# Patient Record
Sex: Female | Born: 1975
Health system: Southern US, Community
[De-identification: ages and names within clinical notes are randomized; demographics above are authoritative.]

## PROBLEM LIST (undated history)

## (undated) DIAGNOSIS — Z789 Other specified health status: Secondary | ICD-10-CM

## (undated) HISTORY — DX: Other specified health status: Z78.9

## (undated) HISTORY — PX: OTHER SURGICAL HISTORY: SHX169

---

## 1999-10-17 ENCOUNTER — Other Ambulatory Visit: Admission: RE | Admit: 1999-10-17 | Discharge: 1999-10-17 | Payer: Self-pay | Admitting: Obstetrics and Gynecology

## 2000-02-09 ENCOUNTER — Emergency Department (HOSPITAL_COMMUNITY): Admission: EM | Admit: 2000-02-09 | Discharge: 2000-02-09 | Payer: Self-pay | Admitting: Internal Medicine

## 2000-02-19 ENCOUNTER — Emergency Department (HOSPITAL_COMMUNITY): Admission: EM | Admit: 2000-02-19 | Discharge: 2000-02-19 | Payer: Self-pay | Admitting: Unknown Physician Specialty

## 2001-01-16 ENCOUNTER — Other Ambulatory Visit: Admission: RE | Admit: 2001-01-16 | Discharge: 2001-01-16 | Payer: Self-pay | Admitting: Obstetrics and Gynecology

## 2002-01-20 ENCOUNTER — Other Ambulatory Visit: Admission: RE | Admit: 2002-01-20 | Discharge: 2002-01-20 | Payer: Self-pay | Admitting: Obstetrics and Gynecology

## 2003-01-24 ENCOUNTER — Other Ambulatory Visit: Admission: RE | Admit: 2003-01-24 | Discharge: 2003-01-24 | Payer: Self-pay | Admitting: Obstetrics and Gynecology

## 2003-08-15 ENCOUNTER — Other Ambulatory Visit: Admission: RE | Admit: 2003-08-15 | Discharge: 2003-08-15 | Payer: Self-pay | Admitting: Obstetrics and Gynecology

## 2004-01-19 ENCOUNTER — Inpatient Hospital Stay (HOSPITAL_COMMUNITY): Admission: AD | Admit: 2004-01-19 | Discharge: 2004-02-20 | Payer: Self-pay | Admitting: Obstetrics and Gynecology

## 2004-02-19 ENCOUNTER — Encounter (INDEPENDENT_AMBULATORY_CARE_PROVIDER_SITE_OTHER): Payer: Self-pay | Admitting: Specialist

## 2004-03-16 ENCOUNTER — Other Ambulatory Visit: Admission: RE | Admit: 2004-03-16 | Discharge: 2004-03-16 | Payer: Self-pay | Admitting: Obstetrics and Gynecology

## 2004-12-18 ENCOUNTER — Ambulatory Visit: Payer: Self-pay | Admitting: Family Medicine

## 2005-09-10 IMAGING — US US FETAL BPP W/O NONSTRESS
1 series · 18 of 28 positions shown · non-contrast
Comparison: none

CLINICAL DATA: 28-year-old female at approximately 33 weeks gestation.  Preterm bleeding and cramping.

[Series 1: us ob comp +14 wk · 18 of 71 slices shown]
[im 1/71]
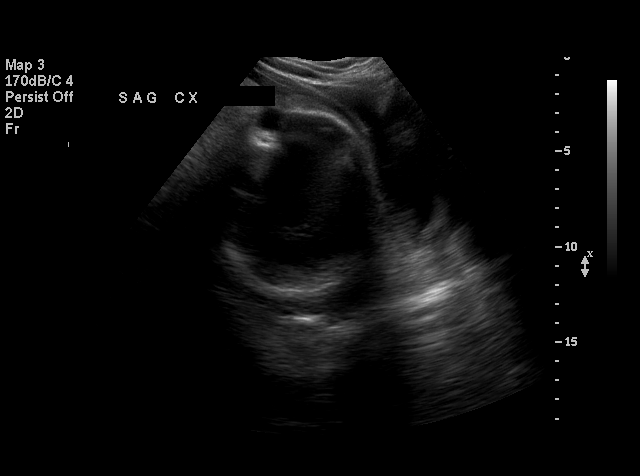
[im 6/71]
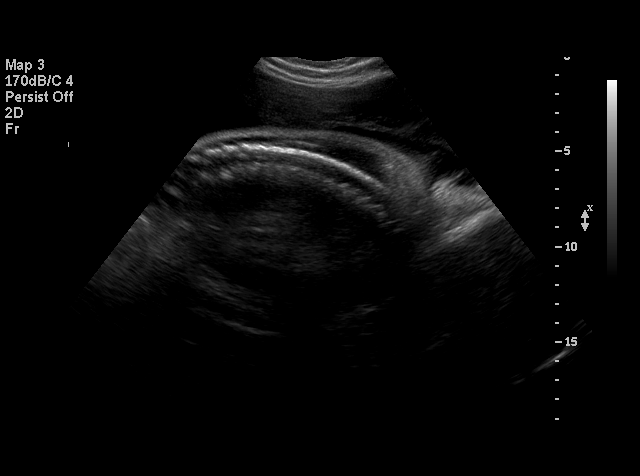
[im 8/71]
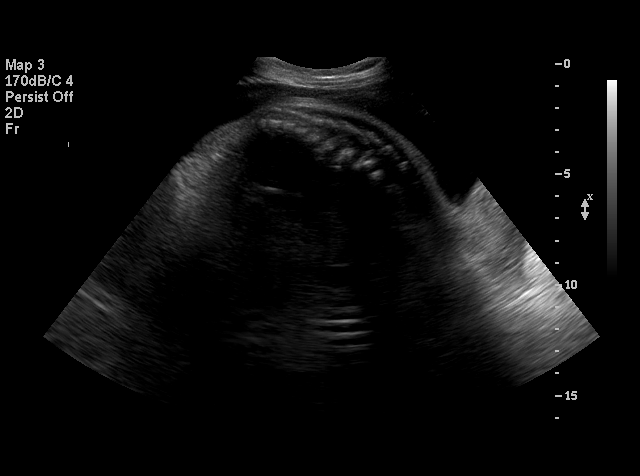
[im 13/71]
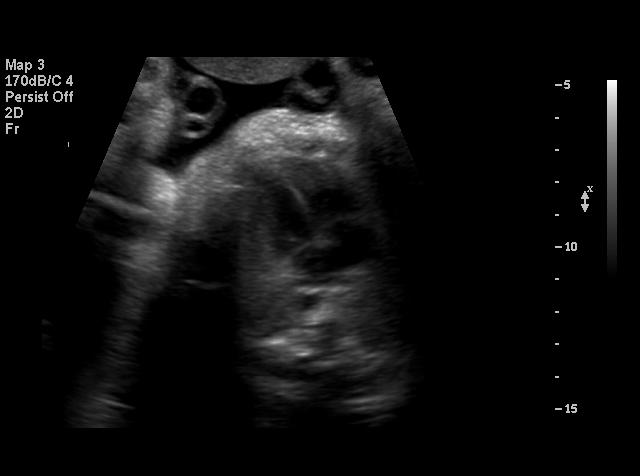
[im 19/71]
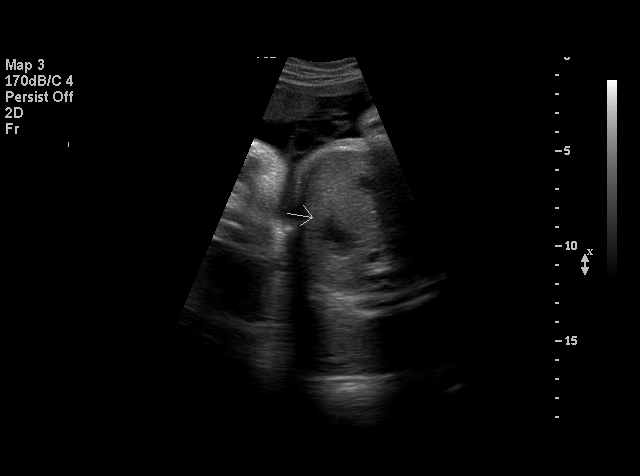
[im 21/71]
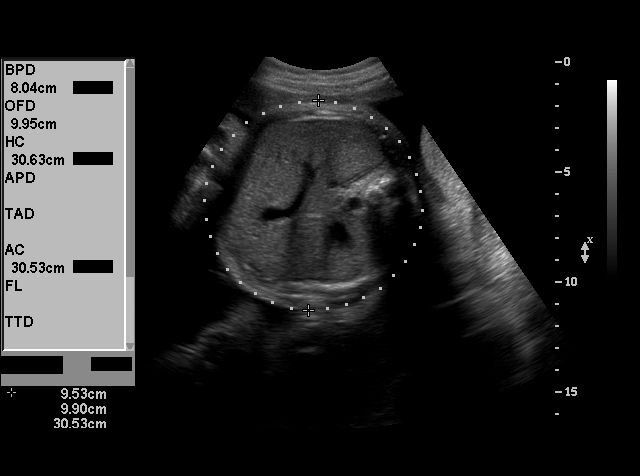
[im 26/71]
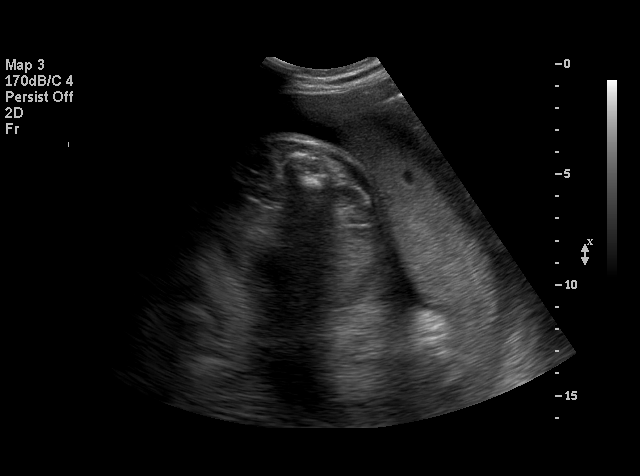
[im 29/71]
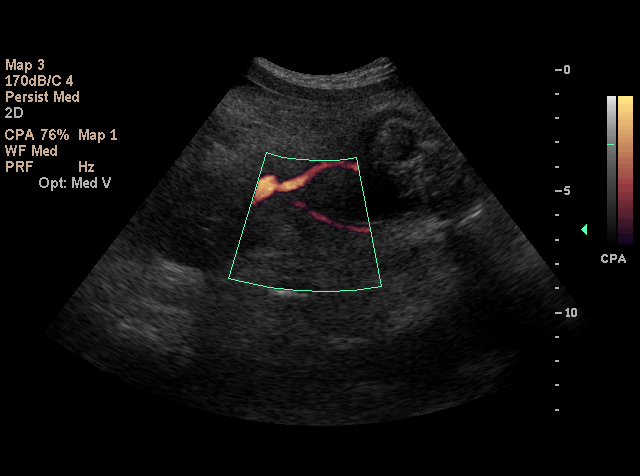
[im 34/71]
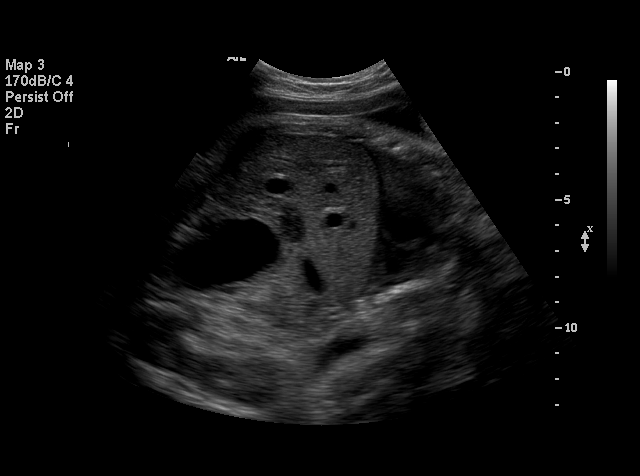
[im 37/71]
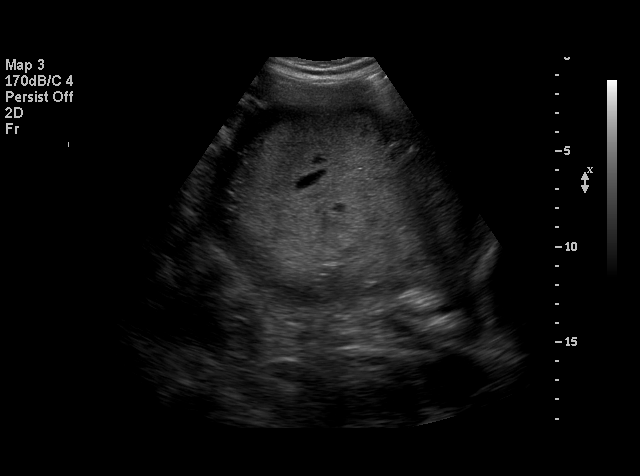
[im 42/71]
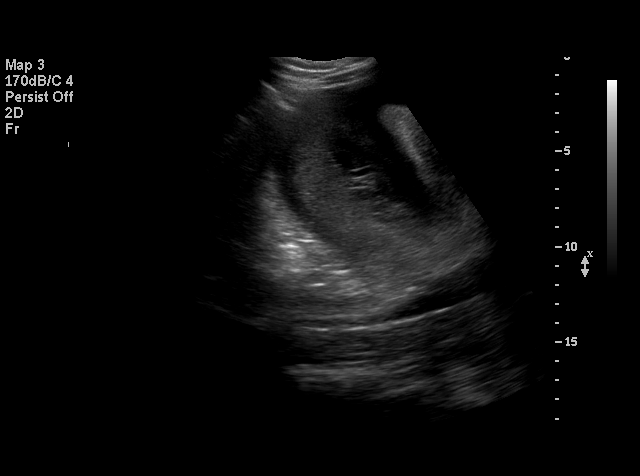
[im 45/71]
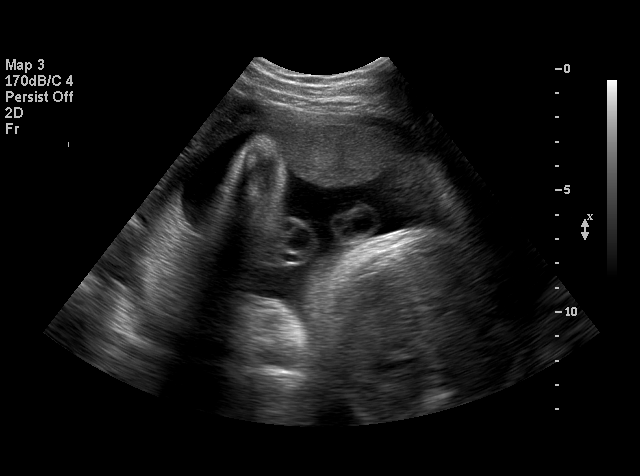
[im 50/71]
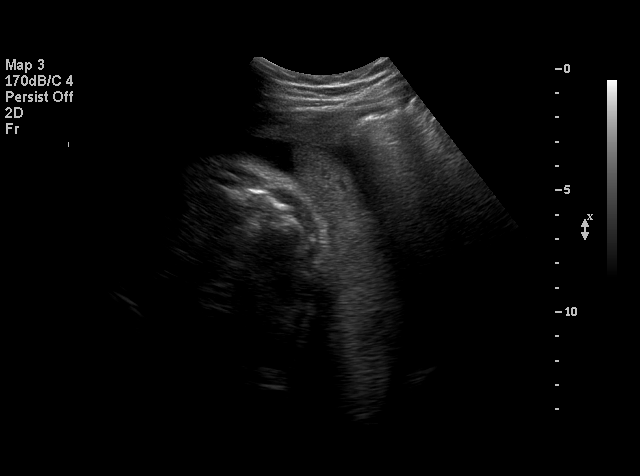
[im 55/71]
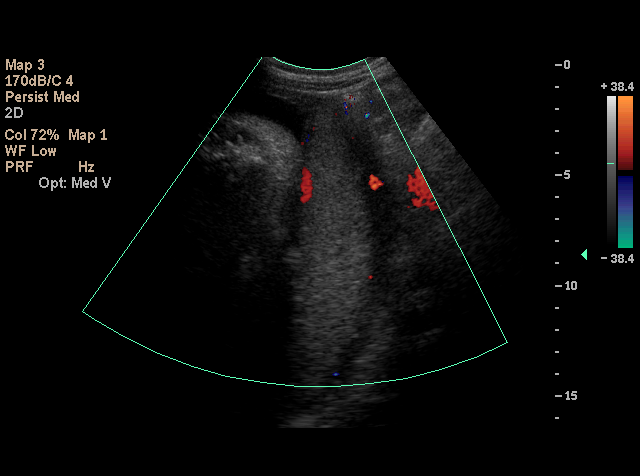
[im 58/71]
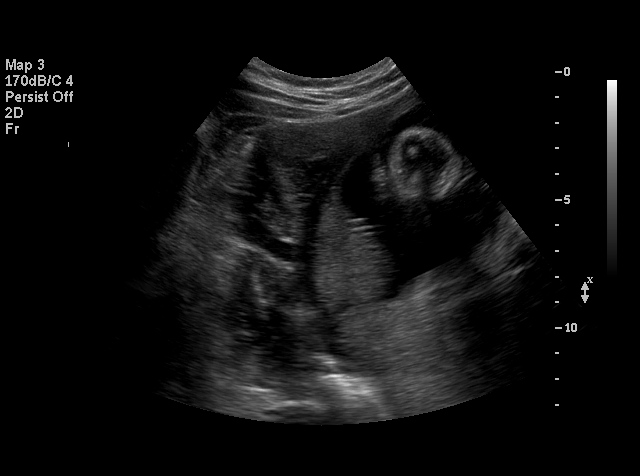
[im 63/71]
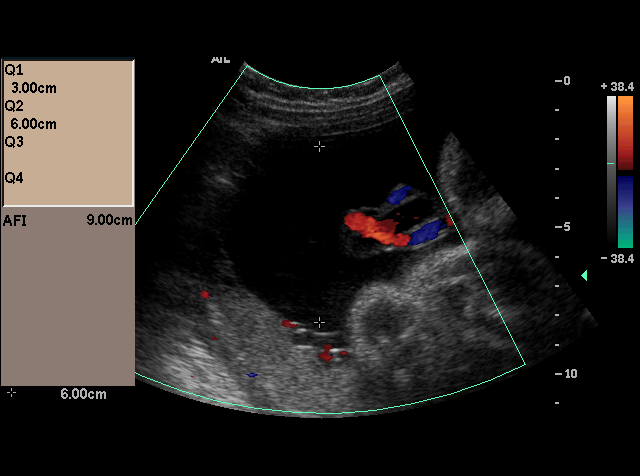
[im 65/71]
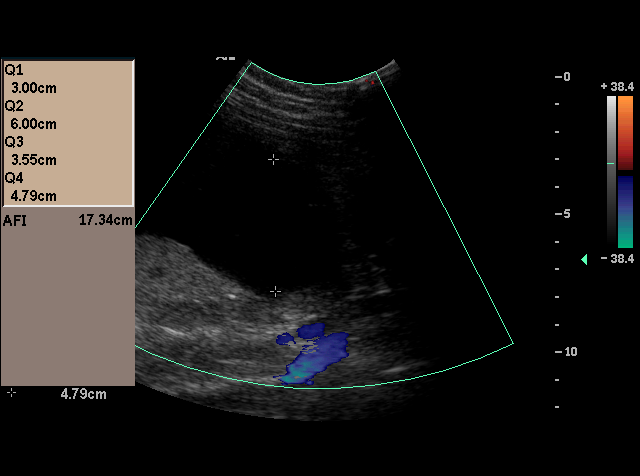
[im 71/71]
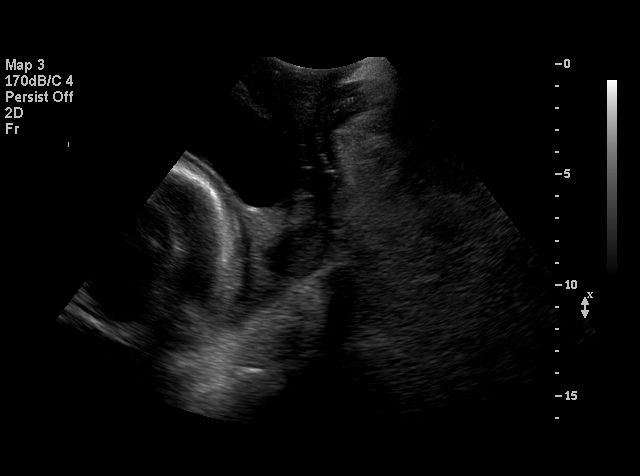

[18 of 28 positions shown; findings below may reference images not displayed]

OBSTETRICAL ULTRASOUND
Number of Fetuses:  1
Heart Rate:  154
Movement:  Yes
Breathing:  Yes  
Presentation:  Cephalic
Placental Location:  Fundal, posterior, left lateral
Grade:  I
Previa:  No
Comment:  Anterior succenturiate placenta noted on the right.  
Amniotic Fluid (Subjective):  Normal
Amniotic Fluid (Objective):   17.3 cm AFI (5th -95th%ile =   8.3 ? 24.5 cm for 33 wks)

FETAL BIOMETRY
BPD:  7.8 cm   31 w 2 d
HC:  30.5 cm   34 w 0 d
AC:  30.3 cm   34 w 2 d
FL:  6.3 cm   32 w 5 d

MEAN GA:  33 w 1 d

EFW:  5597 g (H) 75th ? 90th%ile (5755 ? 7674 g) For 33 wks

FETAL ANATOMY
Lateral Ventricles:    Not visualized 
Thalami/CSP:      Visualized 
Posterior Fossa:  Not visualized 
Nuchal Region:    N/A
Spine:      Visualized 
4 Chamber Heart on Left:      Visualized 
Stomach on Left:      Visualized 
3 Vessel Cord:    Visualized 
Cord Insertion site:    Visualized 
Kidneys:  Visualized 
Bladder:  Visualized 
Extremities:      Not visualized   

ADDITIONAL ANATOMY VISUALIZED:  LVOT, RVOT, orbits, diaphragm, and female genitalia

Evaluation limited by:  Fetal position and advanced gestational age 

MATERNAL FINDINGS
Cervix:   3.4 cm Transabdominally

BIOPHYSICAL PROFILE

Movement:  2    Time:  30
Breathing:  0
Tone:  2
Amniotic Fluid:  2

Total Score:  6
IMPRESSION: Single living intrauterine gestation in cephalic presentation with estimated gestational age by this ultrasound of 33 weeks 1 day.  
Posterior fundal placenta without evidence of placenta previa.  
Normal amniotic fluid volume.
Fetal anatomy as described, limited by fetal position and advanced gestational age.
BPP [DATE] at 30 minutes.

## 2005-09-14 IMAGING — US US FETAL BPP W/O NONSTRESS
1 series · 18 of 23 positions shown · non-contrast
Comparison: none

CLINICAL DATA: Vaginal bleeding.  Assess fetal well-being.

[Series 1: us ob re-eval · 18 of 23 slices shown]
[im 1/23]
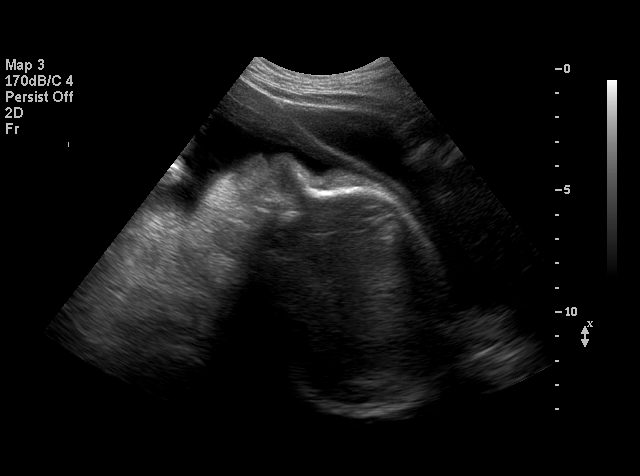
[im 2/23]
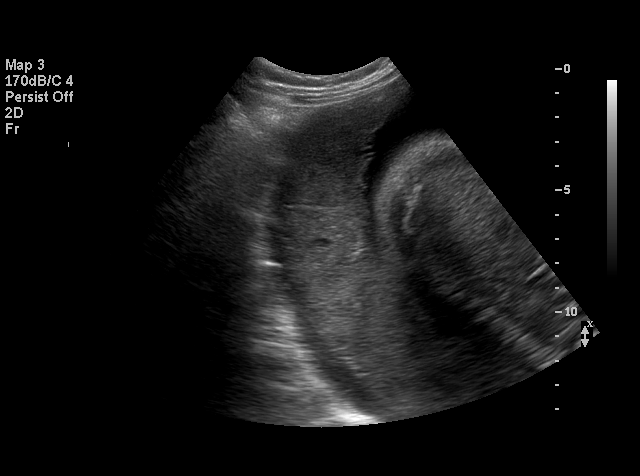
[im 4/23]
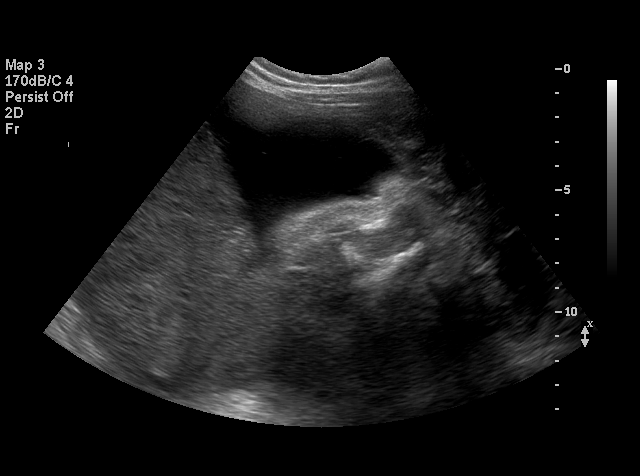
[im 5/23]
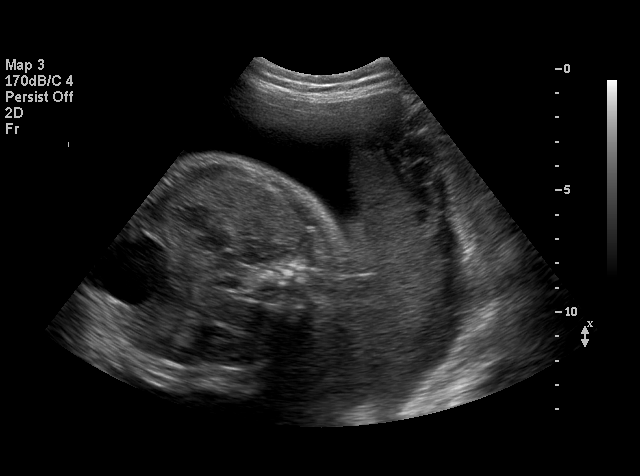
[im 6/23]
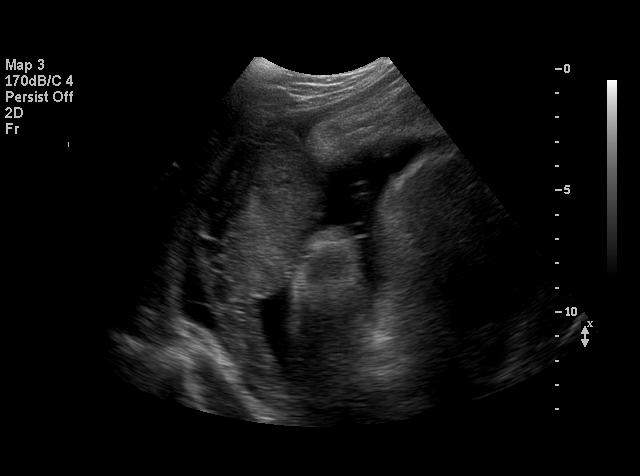
[im 8/23]
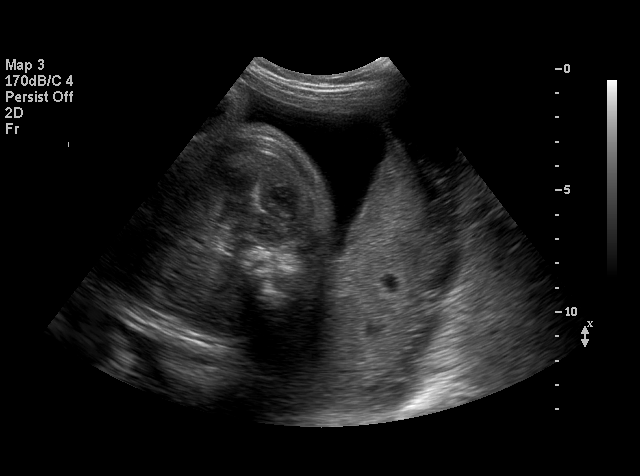
[im 9/23]
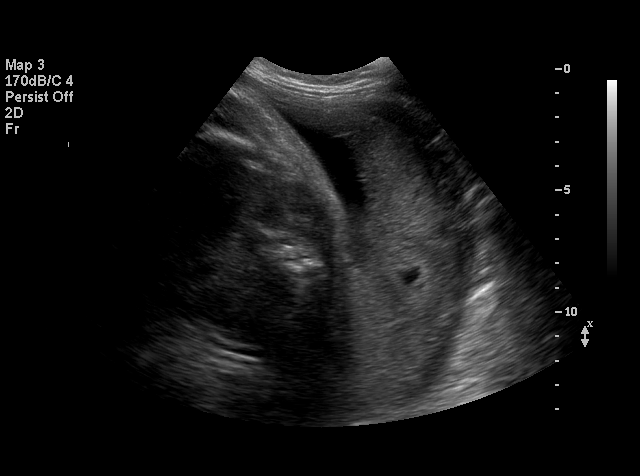
[im 10/23]
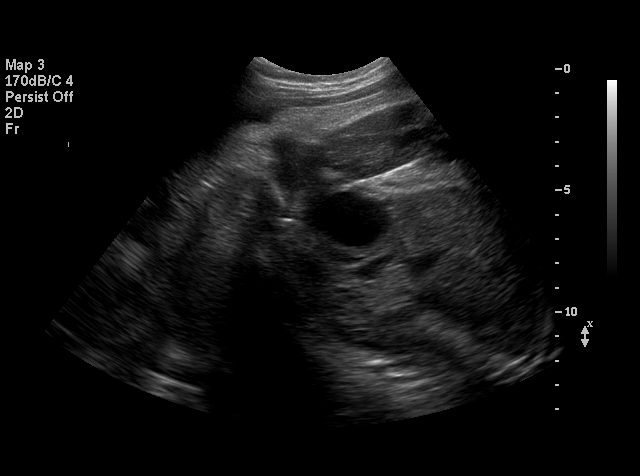
[im 11/23]
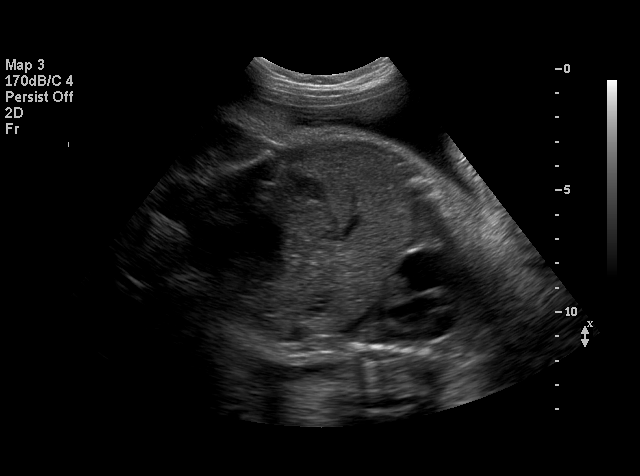
[im 13/23]
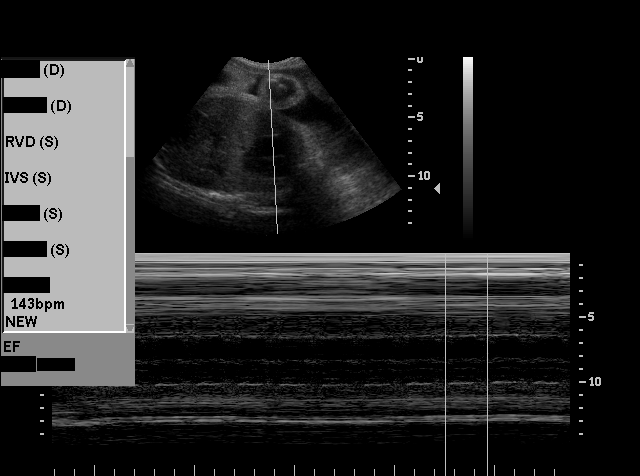
[im 14/23]
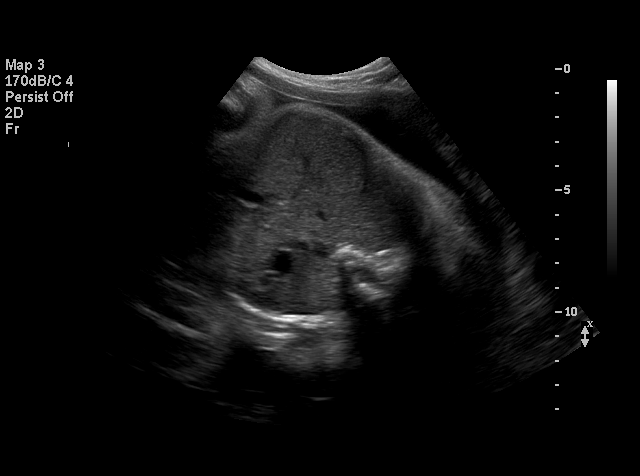
[im 15/23]
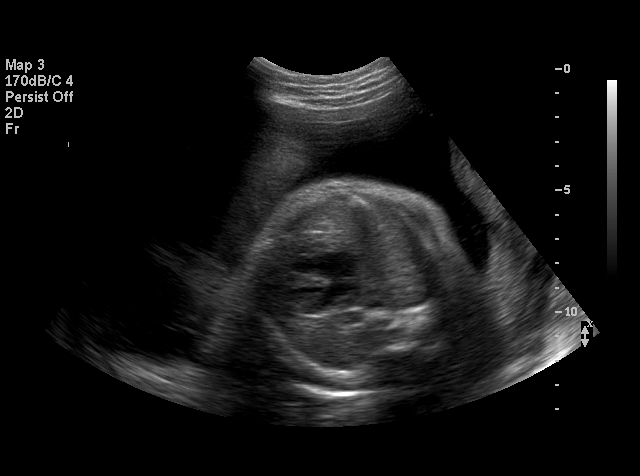
[im 16/23]
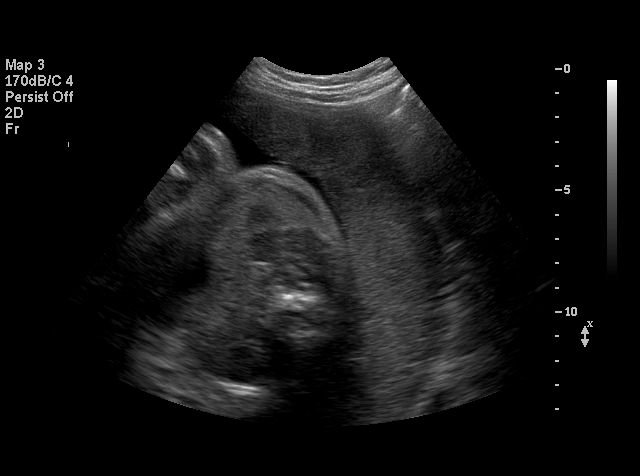
[im 18/23]
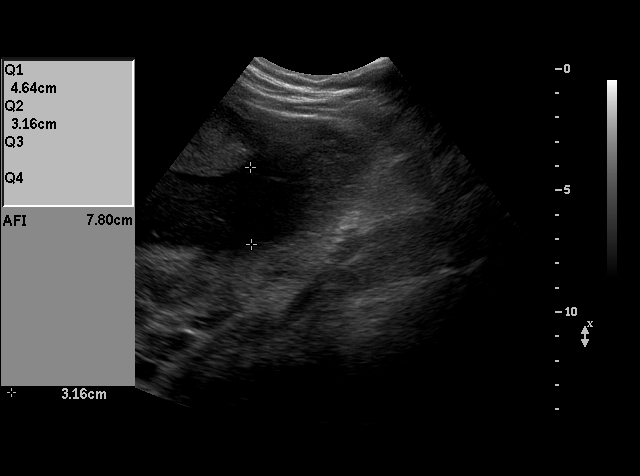
[im 19/23]
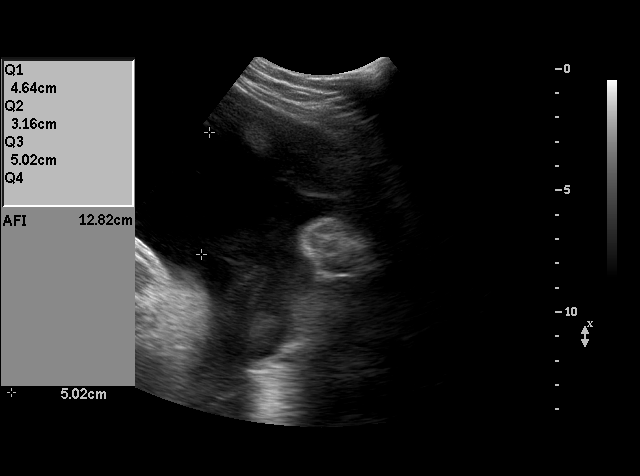
[im 20/23]
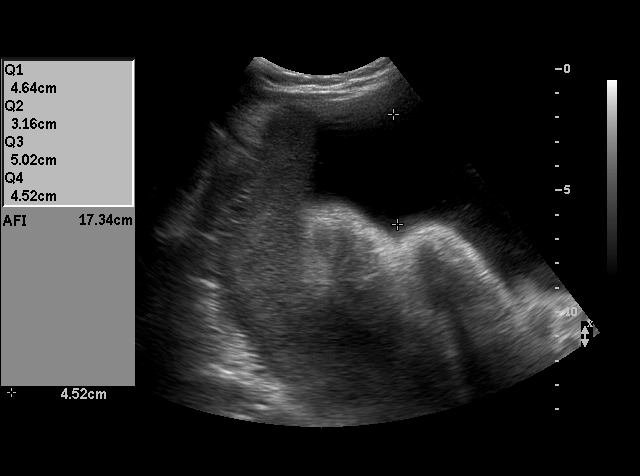
[im 22/23]
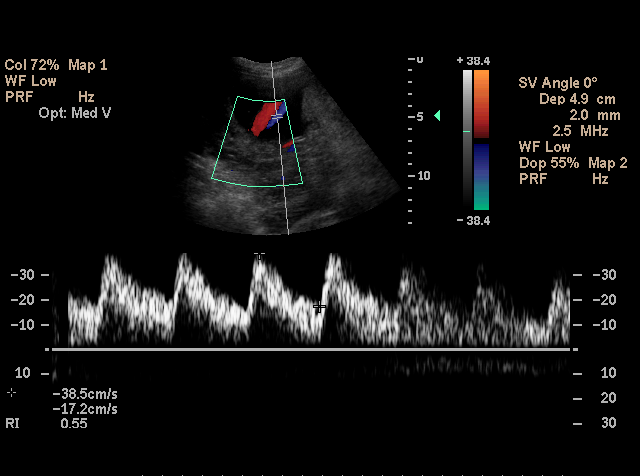
[im 23/23]
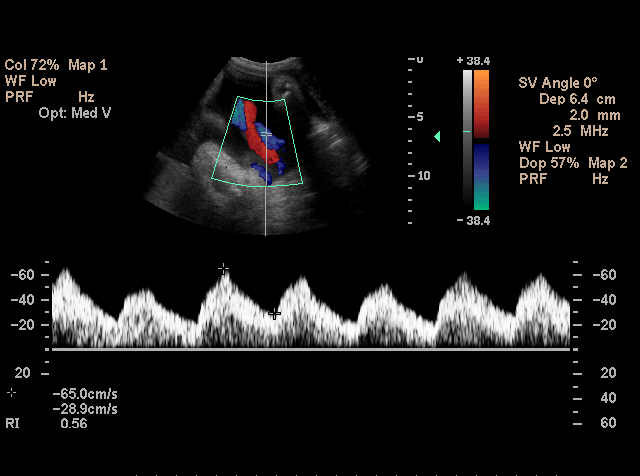

[18 of 23 positions shown; findings below may reference images not displayed]

BIOPHYSICAL PROFILE  

Number of Fetuses:  1
Heart rate:  143
Movement:  Yes
Breathing:  Yes
Presentation:  Cephalic
Placental Location:  Fundal, posterior
Grade:  II
Previa:  No
Comment:  Again noted is an anterior succenturiate lobe.
Amniotic Fluid (Subjective):  Normal
Amniotic Fluid (Objective):  17.3 cm AFI (5th -95th%ile =   8.1 ? 24.8 cm for 34 wks)

Fetal measurements and complete anatomic evaluation were not requested.  The following fetal anatomy was visualized on this exam:  Stomach, kidneys, bladder and diaphragm

BPP SCORING
Movements:  2  Time:  10 minutes
Breathing:  2
Tone:  2
Amniotic Fluid:  2
Total Score:  8

MATERNAL FINDINGS:
Cervix:  Not evaluated

DOPPLER ULTRASOUND OF FETUS

Umbilical Artery S/D Ratio:  2.30    (NL< 3.57)
IMPRESSION: Biophysical profile score 8 of 8.
Subjectively and quantitatively normal amniotic fluid. 
No focal late developing anatomic abnormalities are seen with reevaluation of the stomach, kidneys, and bladder.  Stable anterior succenturiate lobe.
Normalization of the umbilical artery doppler is noted today.  Again the middle cerebral artery doppler exam could not be performed due to head postion.

## 2005-09-16 IMAGING — CR DG CHEST 1V
2 series · 2 of 2 positions shown · non-contrast
Comparison: none

CLINICAL DATA: Difficulty breathing.  Reevaluate since prior study on 01/24/04.
PA CHEST:
Comparison is made to prior study dated 01/24/04.  
Lungs remain poorly expanded.  There has been no change in the bibasilar density, likely representing atelectasis and/or pneumonia.  There is still a small amount of fluid in the minor fissure.  No new findings are noted.
IMPRESSION
  No significant change in bibasilar atelectasis and/or pneumonia.

[view not recorded (1 of 2)]
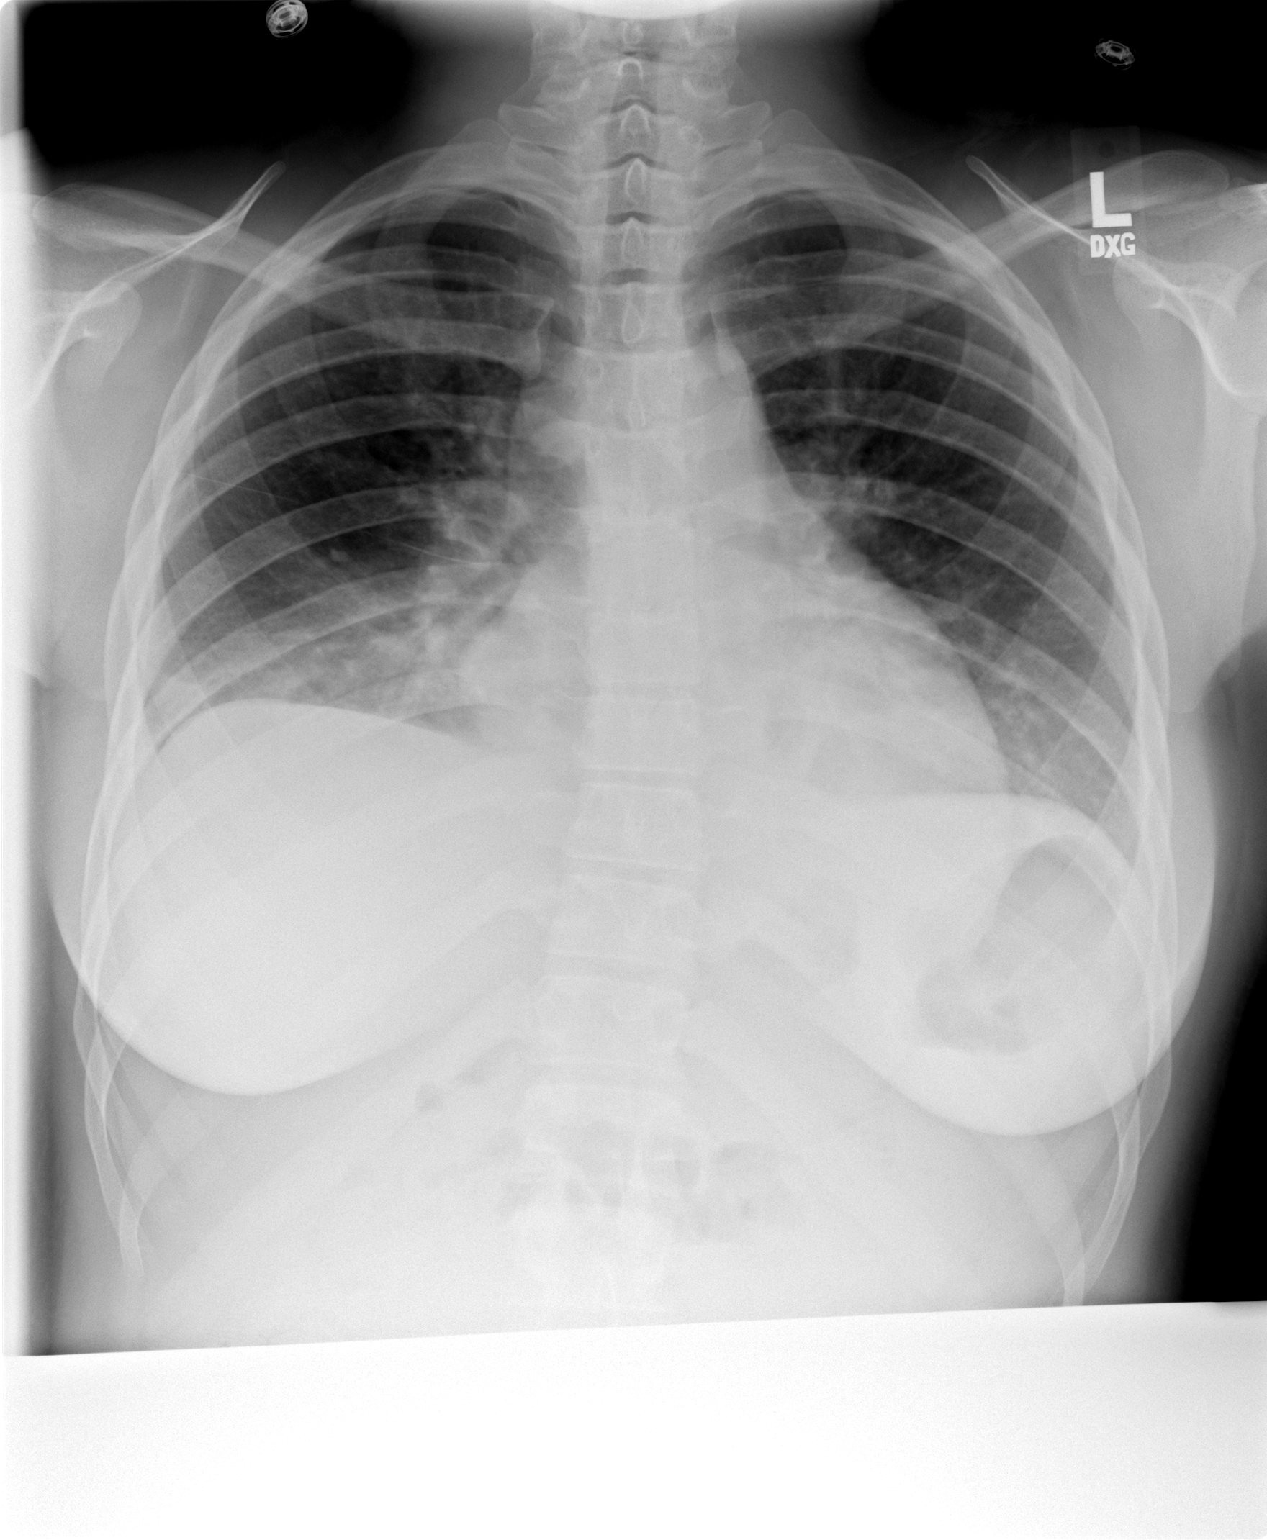

[view not recorded (2 of 2)]
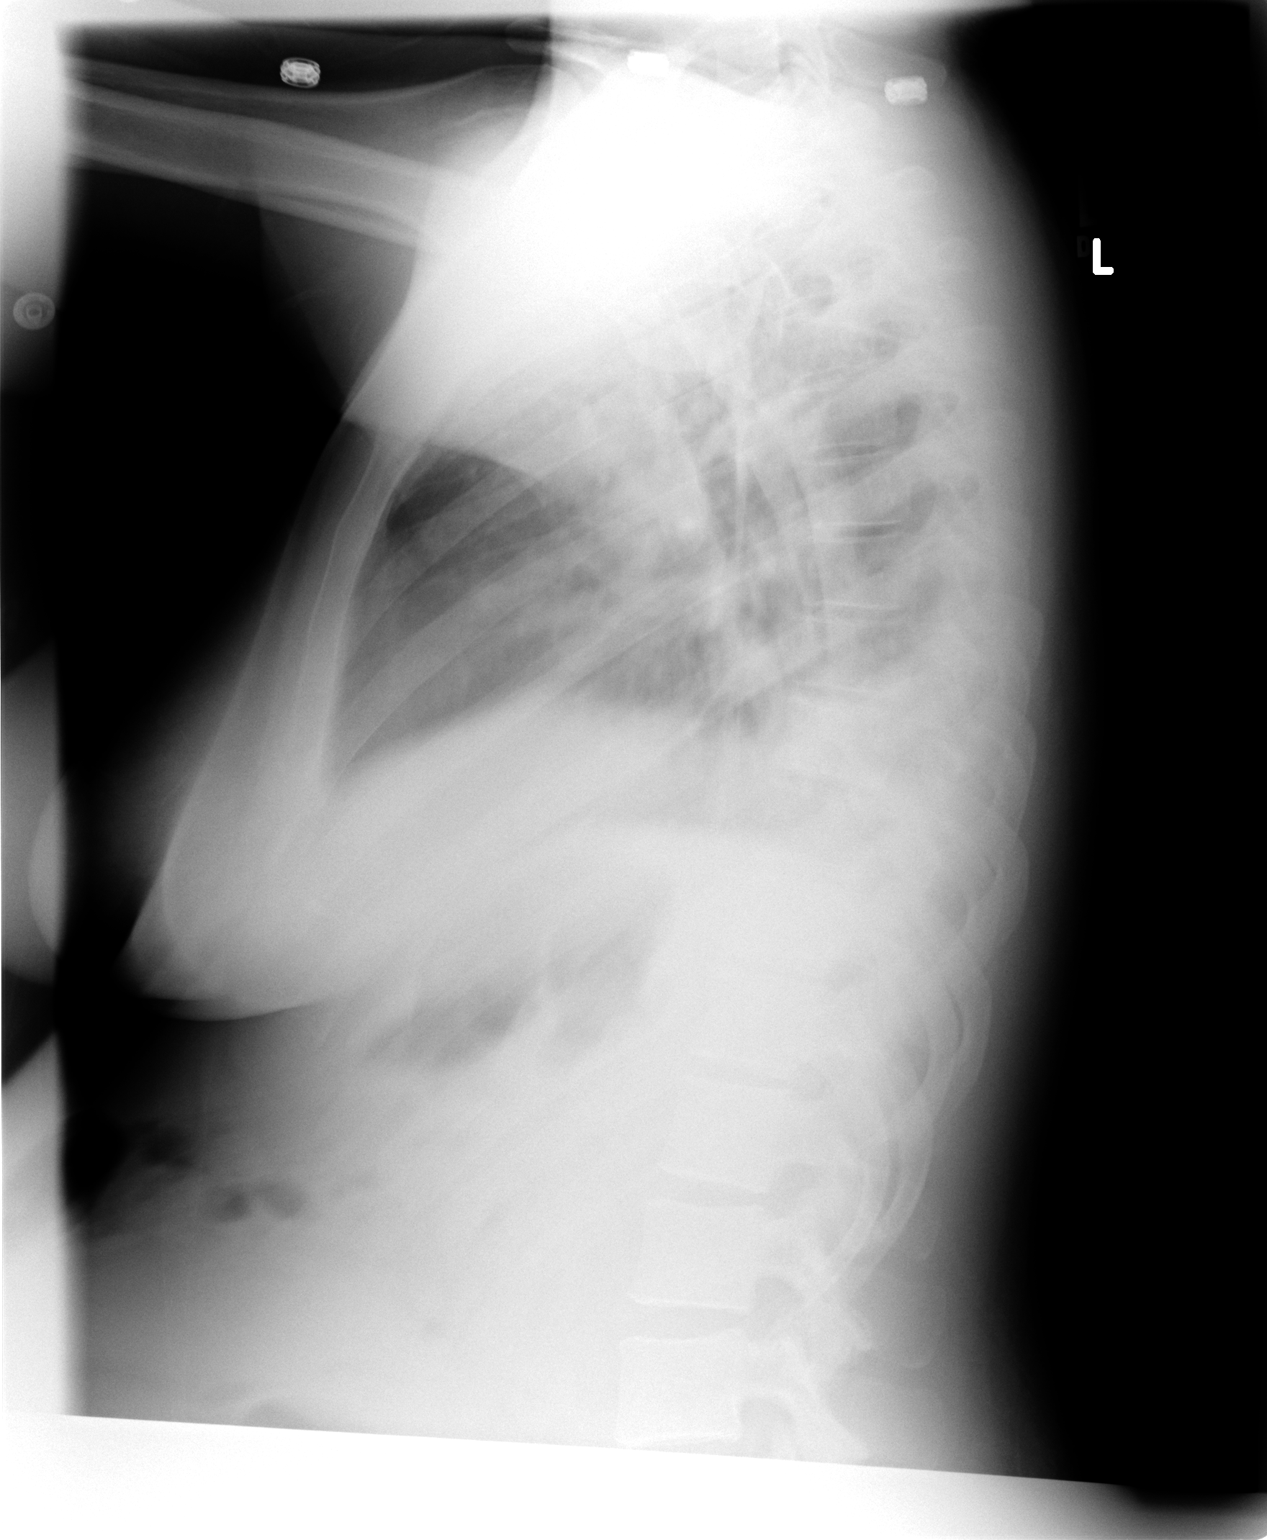

[2 of 2 positions shown; findings below may reference images not displayed]

## 2005-09-18 IMAGING — US US FETAL BPP W/O NONSTRESS
1 series · 18 of 23 positions shown · non-contrast
Comparison: none

CLINICAL DATA: 28 year-old G1P0, bleeding, nonreactive NSTs with variables.

[Series 1: us fetal bpp w/o nonstress · 18 of 23 slices shown]
[im 1/23]
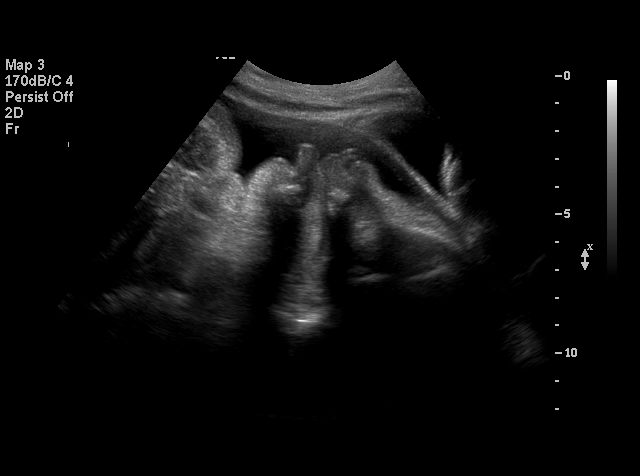
[im 2/23]
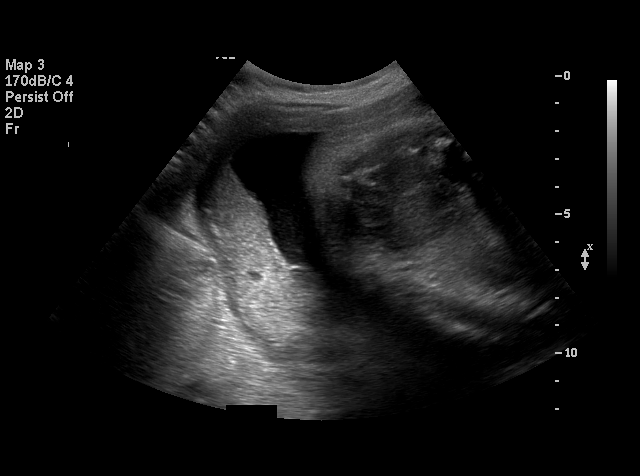
[im 4/23]
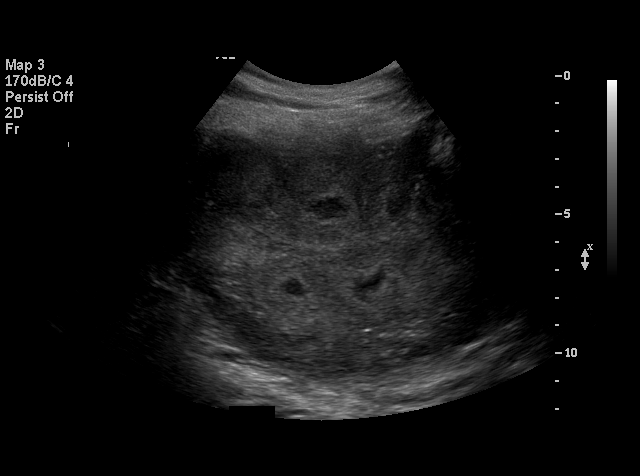
[im 5/23]
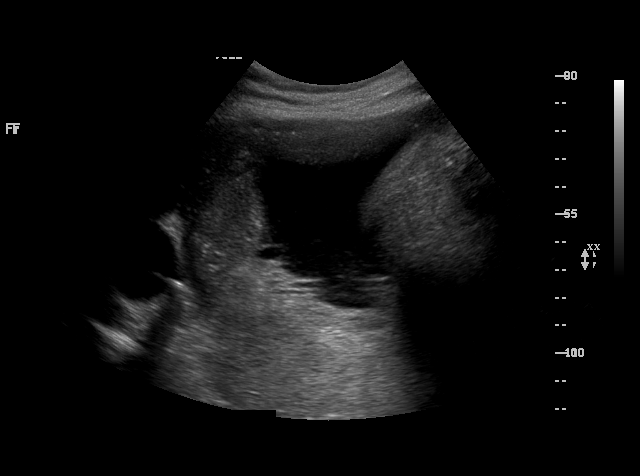
[im 6/23]
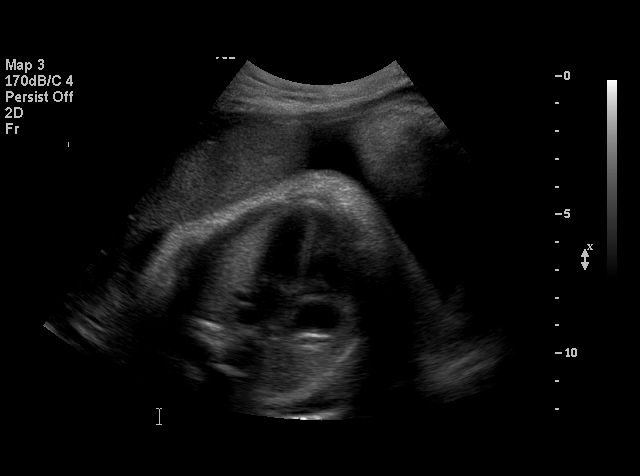
[im 8/23]
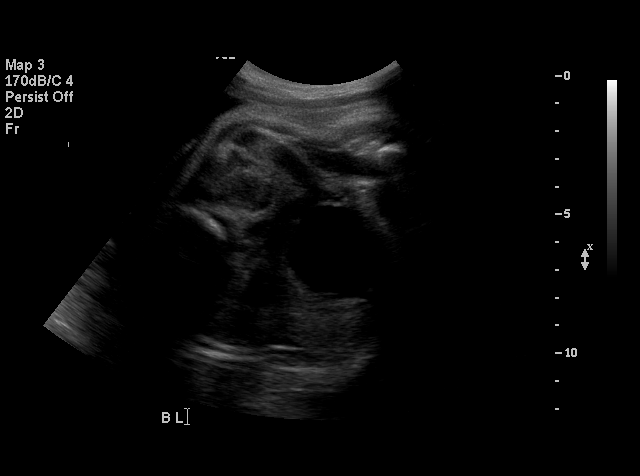
[im 9/23]
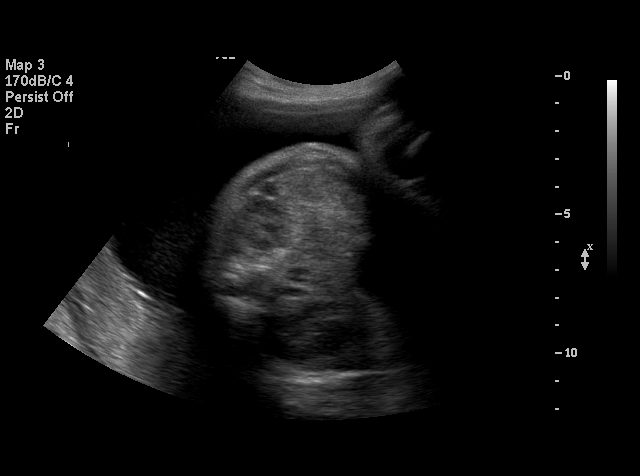
[im 10/23]
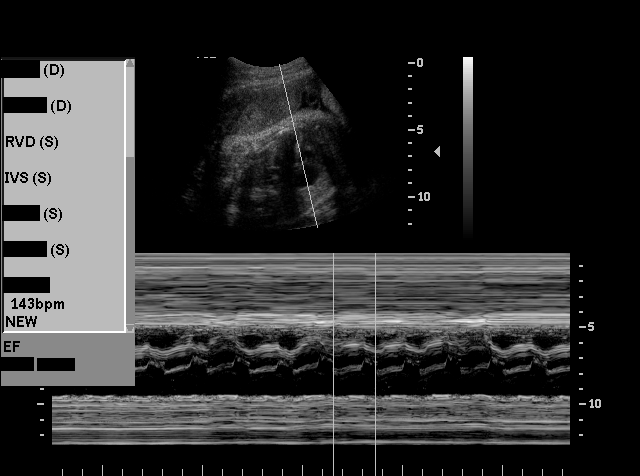
[im 11/23]
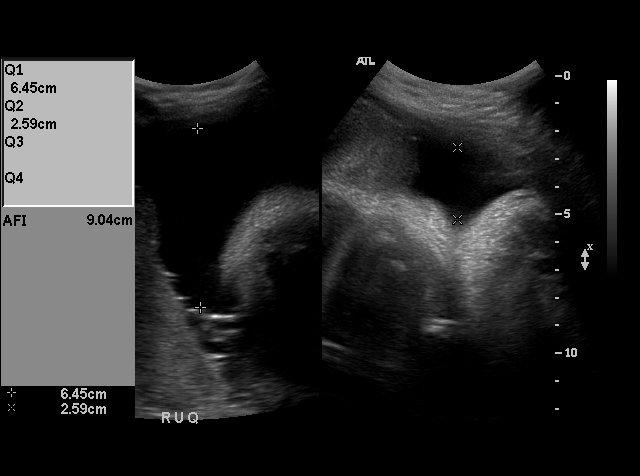
[im 13/23]
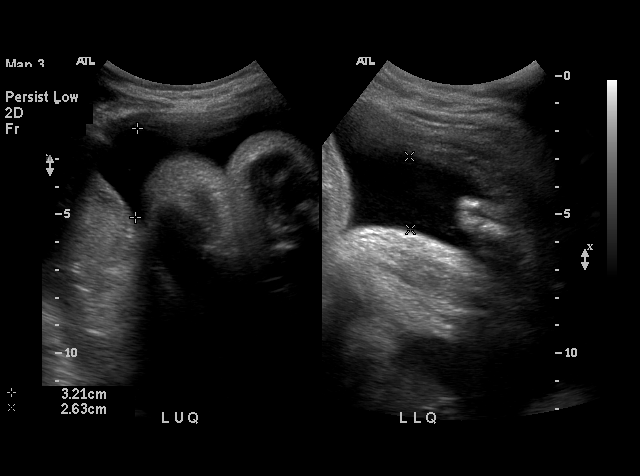
[im 14/23]
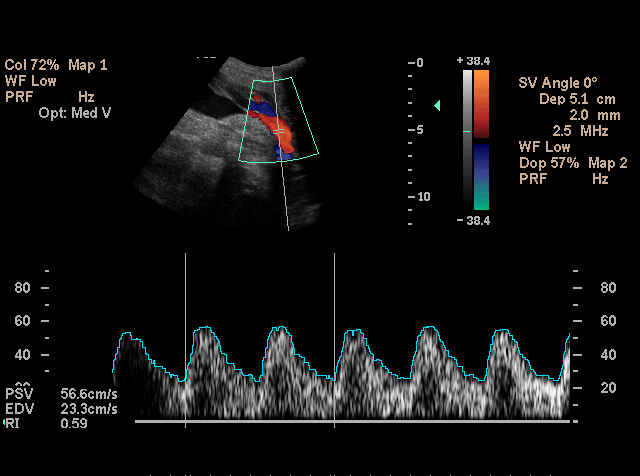
[im 15/23]
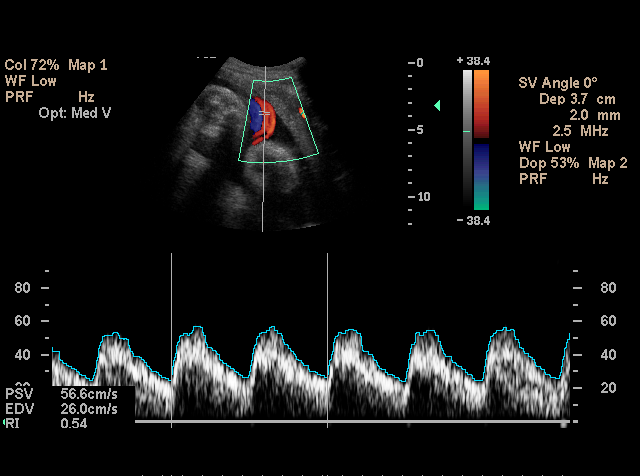
[im 16/23]
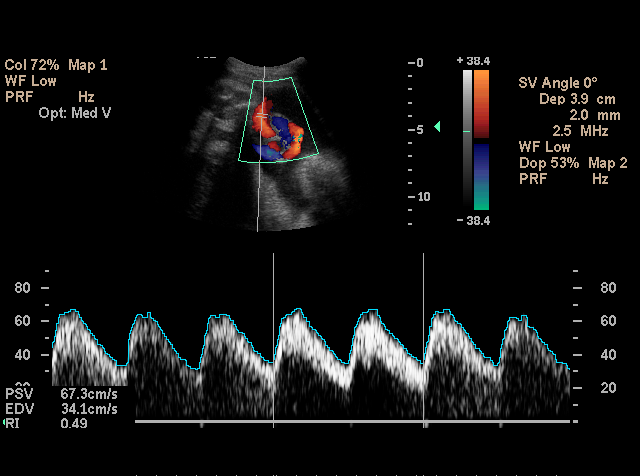
[im 18/23]
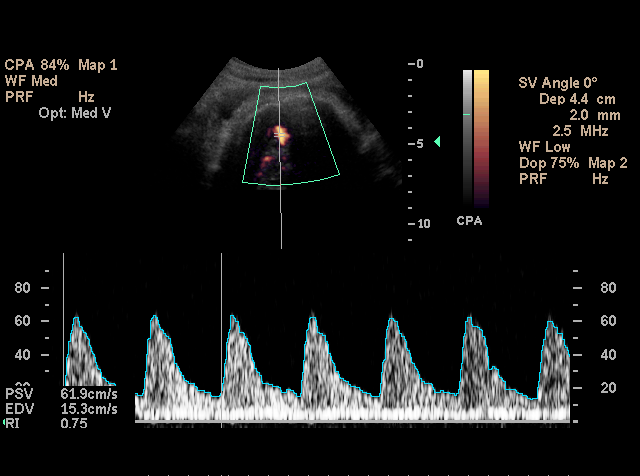
[im 19/23]
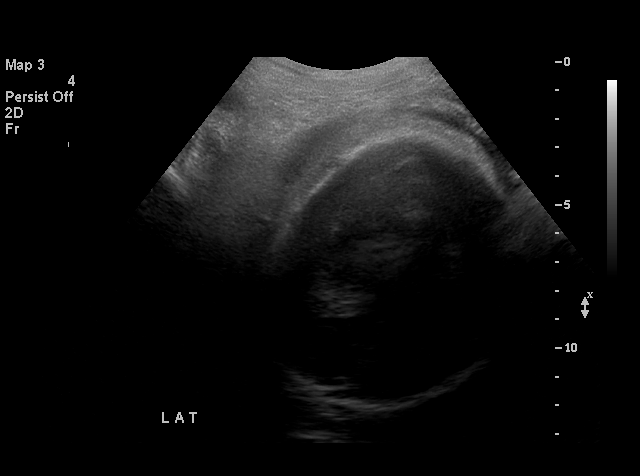
[im 20/23]
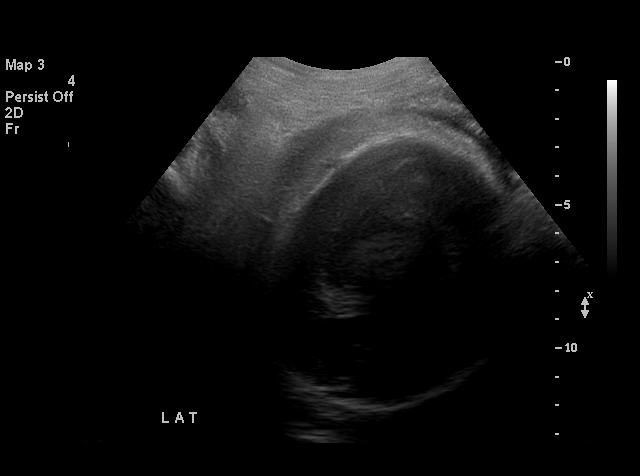
[im 22/23]
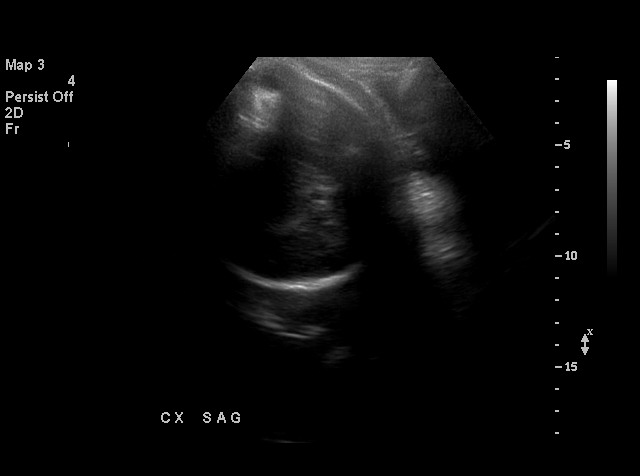
[im 23/23]
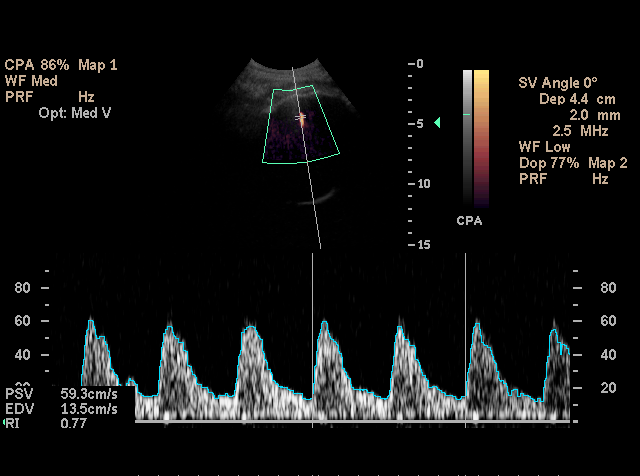

[18 of 23 positions shown; findings below may reference images not displayed]

BIOPHYSICAL PROFILE

 Number of Fetuses: 1
 Heart rate:  143 BPM
 Movement:  Yes
 Breathing: Yes
 Presentation: Cephalic
 Placental Location: Fundal, posterior
 Grade: II
 Previa: No
 Amniotic Fluid (Subjective): Normal
 Amniotic Fluid (Objective): 14.9 cm AFI (8th-48th %ile = 8.1 to 24.8 cm for 34 weeks)

 Fetal measurements and complete anatomic evaluation were not requested.  The following fetal anatomy was visualized on this exam:
 lateral ventricles, four chamber heart, stomach, kidneys, and bladder.
 BPP SCORING
 Movements: 2
 Breathing: 2
 Tone: 2
 Amniotic Fluid: 2
 Total Score: 8 (for 20 minutes)

 DOPPLER ULTRASOUND OF FETUS

 Umbilical Artery S/D Ratio:  2.2 (NL< 3.57)

 Middle Cerebral Artery PI:  1.41 (NL>1.35 )

 MATERNAL FINDINGS:
 Cervix: Not evaluated.
IMPRESSION: Single living intrauterine fetus in cephalic presentation.  Amniotic fluid volume is within normal limits.  There is no evidence for placenta previa or vasa previa.  Note is made of anterior succenturiate lobe.
 Biophysical profile is 8 of 8.
 Normal umbilical artery and middle cerebral artery Doppler evaluation.

## 2005-12-13 ENCOUNTER — Ambulatory Visit: Payer: Self-pay | Admitting: Family Medicine

## 2009-06-27 LAB — CONVERTED CEMR LAB: Pap Smear: NORMAL

## 2010-01-08 ENCOUNTER — Ambulatory Visit: Payer: Self-pay | Admitting: Internal Medicine

## 2010-01-08 DIAGNOSIS — J309 Allergic rhinitis, unspecified: Secondary | ICD-10-CM | POA: Insufficient documentation

## 2010-01-12 ENCOUNTER — Ambulatory Visit: Payer: Self-pay | Admitting: Internal Medicine

## 2010-01-12 LAB — CONVERTED CEMR LAB
AST: 12 units/L (ref 0–37)
BUN: 13 mg/dL (ref 6–23)
Basophils Absolute: 0 10*3/uL (ref 0.0–0.1)
Basophils Relative: 1 % (ref 0–1)
Bilirubin, Direct: 0.1 mg/dL (ref 0.0–0.3)
CO2: 21 meq/L (ref 19–32)
Calcium: 8.7 mg/dL (ref 8.4–10.5)
Cholesterol: 144 mg/dL (ref 0–200)
Creatinine, Ser: 0.76 mg/dL (ref 0.40–1.20)
Eosinophils Absolute: 0.1 10*3/uL (ref 0.0–0.7)
Eosinophils Relative: 1 % (ref 0–5)
Glucose, Bld: 92 mg/dL (ref 70–99)
HDL: 63 mg/dL (ref >39)
Hemoglobin: 11.7 g/dL — ABNORMAL LOW (ref 12.0–15.0)
Ketones, ur: NEGATIVE mg/dL
Lymphocytes Relative: 28 % (ref 12–46)
Lymphs Abs: 1.2 10*3/uL (ref 0.7–4.0)
Monocytes Absolute: 0.4 10*3/uL (ref 0.1–1.0)
Monocytes Relative: 10 % (ref 3–12)
Neutrophils Relative %: 61 % (ref 43–77)
Nitrite: NEGATIVE
RBC: 4.39 M/uL (ref 3.87–5.11)
RDW: 14.6 % (ref 11.5–15.5)
Specific Gravity, Urine: 1.027 (ref 1.005–1.030)
TSH: 0.479 microintl units/mL (ref 0.350–4.500)
Total Protein: 7.3 g/dL (ref 6.0–8.3)
Triglycerides: 54 mg/dL (ref <150)
Urobilinogen, UA: 0.2 (ref 0.0–1.0)
WBC: 4.2 10*3/uL (ref 4.0–10.5)
pH: 6.5 (ref 5.0–8.0)

## 2010-01-13 ENCOUNTER — Encounter: Payer: Self-pay | Admitting: Internal Medicine

## 2010-01-22 ENCOUNTER — Ambulatory Visit: Payer: Self-pay | Admitting: Internal Medicine

## 2010-01-22 ENCOUNTER — Encounter (INDEPENDENT_AMBULATORY_CARE_PROVIDER_SITE_OTHER): Payer: Self-pay | Admitting: *Deleted

## 2010-02-16 ENCOUNTER — Ambulatory Visit: Payer: Self-pay | Admitting: Internal Medicine

## 2010-02-16 DIAGNOSIS — J018 Other acute sinusitis: Secondary | ICD-10-CM | POA: Insufficient documentation

## 2010-10-05 ENCOUNTER — Ambulatory Visit: Payer: Self-pay | Admitting: Internal Medicine

## 2010-10-05 DIAGNOSIS — R599 Enlarged lymph nodes, unspecified: Secondary | ICD-10-CM | POA: Insufficient documentation

## 2010-10-05 DIAGNOSIS — R51 Headache: Secondary | ICD-10-CM | POA: Insufficient documentation

## 2010-10-05 DIAGNOSIS — R519 Headache, unspecified: Secondary | ICD-10-CM | POA: Insufficient documentation

## 2010-10-08 ENCOUNTER — Telehealth: Payer: Self-pay | Admitting: Internal Medicine

## 2010-10-10 ENCOUNTER — Ambulatory Visit: Payer: Self-pay | Admitting: Interventional Radiology

## 2010-10-10 ENCOUNTER — Ambulatory Visit (HOSPITAL_BASED_OUTPATIENT_CLINIC_OR_DEPARTMENT_OTHER): Admission: RE | Admit: 2010-10-10 | Discharge: 2010-10-10 | Payer: Self-pay | Admitting: Internal Medicine

## 2010-10-11 ENCOUNTER — Telehealth: Payer: Self-pay | Admitting: Internal Medicine

## 2011-01-01 NOTE — Letter (Signed)
Summary: Work Dietitian at [employer]. [address]   [place], [place] [address]   Phone: [phone]  Fax: [phone]      Today's Date: January 22, 2010     Name of Patient: Paige Wheeler [facility]   The above named patient had a medical visit today. Please take this into consideration when reviewing the time away from work.  Special Instructions:  [ X ] None  [  ] To be off the remainder of today, returning to the normal work / school schedule tomorrow.  [  ] To be off until the next scheduled appointment on ______________________.  [  ] Other ________________________________________________________________ ________________________________________________________________________     Sincerely yours,   Pearletha Furl CMA Dr. Thomos Lemons

## 2011-01-01 NOTE — Progress Notes (Signed)
Summary: MRI Results  Phone Note Outgoing Call   Summary of Call: call pt - MRI of brain normal.  radiologist notes mild maxillary sinus mucosal thickening. If pt still having chronic headaches,  I suggest FOV   Initial call taken by: D. Thomos Lemons DO,  October 11, 2010 1:15 PM  Follow-up for Phone Call        call placed to patient at [phone], she has been advised per Dr Artist Pais instructions. She has no additional concerns at this time Follow-up by: Glendell Docker CMA,  October 11, 2010 3:39 PM

## 2011-01-01 NOTE — Progress Notes (Signed)
Summary: Lump on neck question  Phone Note Call from Patient Call back at Home Phone [phone]   Caller: Patient Reason for Call: Talk to Nurse Summary of Call: Pt is concerned about the lump on her neck & would like further testing as that she has been experiencing headaches all weekend, did not want to schedule another OV  Initial call taken by: Lannette Donath,  October 08, 2010 10:00 AM  Follow-up for Phone Call        I suggest referral to ENT Follow-up by: D. Thomos Lemons DO,  October 08, 2010 4:17 PM  Additional Follow-up for Phone Call Additional follow up Details #1::        call was returned to patient she has been advised regarding the referral. She state she does not have a lump in her neck, it is on the hairline at the base of her neck, and she continues to have daily headaches Additional Follow-up by: Glendell Docker CMA,  October 08, 2010 4:29 PM    Additional Follow-up for Phone Call Additional follow up Details #2::    OK to cancel ENT referral.   If pt having recurrent headache,  I suggest trial of muscle relaxer.  see rx  I suggest OV in 2 weeks Follow-up by: D. Thomos Lemons DO,  October 08, 2010 4:31 PM  Additional Follow-up for Phone Call Additional follow up Details #3:: Details for Additional Follow-up Action Taken: call was returned to patient at [phone], she has been advised per Dr Artist Pais instructions.   Patient would like to know if she could have further testing done like a MRI or CT of her head to see what the lump could be. She states that would put her at ease Glendell Docker CMA  October 09, 2010 8:40 AM   [facility] for MRI of brain.  orders placed Additional Follow-up by: D. Thomos Lemons DO,  October 09, 2010 12:23 PM  New/Updated Medications: CYCLOBENZAPRINE HCL 5 MG TABS (CYCLOBENZAPRINE HCL) one by mouth at bedtime Prescriptions: CYCLOBENZAPRINE HCL 5 MG TABS (CYCLOBENZAPRINE HCL) one by mouth at bedtime  #14 x 0   Entered and Authorized by:   D.  Thomos Lemons DO   Signed by:   D. Thomos Lemons DO on 10/08/2010   Method used:   Electronically to        [facility] [address] (retail)       [phone]       [place], [place]  [address]       Ph: [phone]       Fax: [phone]   RxID:   [id number]  call placed to patient at [phone], she has been advised per Dr Artist Pais instructions. She was informed she will be contacted regarding appointment date and time Glendell Docker [facility]  October 09, 2010 2:27 PM

## 2011-01-01 NOTE — Assessment & Plan Note (Signed)
Summary: LUMP ON HEAD/MHF   Vital Signs:  Patient profile:   35 year old female Menstrual status:  regular Height:      65 inches Weight:      141 pounds BMI:     23.55 Temp:     98.3 degrees F oral Pulse rate:   66 / minute Pulse rhythm:   regular Resp:     16 per minute BP sitting:   100 / 80  (left arm) Cuff size:   regular  Vitals Entered By: Mervin Kung CMA Duncan Dull) (October 05, 2010 11:03 AM) Is Patient Diabetic? No Comments Pt has completed Cefuroxime, Nasonex and Hydrocodone homatropine. Nicki Guadalajara Fergerson CMA Duncan Dull)  October 05, 2010 11:08 AM    Primary Care Provider:  Dondra Spry DO   History of Present Illness: 35 y/o AA female c/o small knots in occipital area x 1-2 months she has intermittent headaches and wonders if related to knots no changes in vision no dizziness  Preventive Screening-Counseling & Management  Alcohol-Tobacco     Alcohol drinks/day: 0     Smoking Status: never  Allergies (verified): No Known Drug Allergies  Past History:  Past Medical History: Allergic rhinitis - seasonal        Past Surgical History: Denies surgical history       Family History: Family History Diabetes 1st degree relative Family History Hypertension Family History of Prostate CA 1st degree relative <50 Mother - htn, DM II colon ca - no breast ca - no older brother - healthy    Social History: Never Smoked Alcohol use-no  Married - 8 yrs   10 y/o daughter 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         headaches  Physical Exam  General:  alert, well-developed, and well-nourished.   Eyes:  pupils equal, pupils round, and pupils reactive to light.   Ears:  R ear normal and L ear normal.   Mouth:  pharynx pink and moist.   Lungs:  normal respiratory effort and normal breath sounds.    Heart:  normal rate, regular rhythm, and no gallop.   Extremities:  No lower extremity edema  Neurologic:  cranial nerves II-XII intact, gait normal, and DTRs symmetrical and normal.   Skin:  small suboccipital lymph nodes   Impression & Recommendations:  Problem # 1:  LYMPHADENOPATHY (ICD-785.6)  35 y/o AA reports "knot" right occipital area.  she has subcentimeter lymph node.  no other enlarged lymph nodes.  likely reactive from scalp irritation. monitor for now if increase in size, refer to ENT for further evaluation  Orders: Radiology Referral (Radiology)  Problem # 2:  HEADACHE (ICD-784.0)  pt reports chronic headaches.  she is worried knots on back of head related to headaches obtain MRI of brain to rule out intracranial lesion  Orders: Radiology Referral (Radiology)  Complete Medication List: 1)  Lo Loestrin Fe 1 Mg-10 Mcg / 10 Mcg Tabs (Norethin-eth estrad-fe biphas) .... Take 1 tablet by mouth once a day 2)  Cefuroxime Axetil 500 Mg Tabs (Cefuroxime axetil) .... One by mouth bid 3)  Nasonex 50 Mcg/act Susp (Mometasone furoate) .... 2 sprays each nostri once daily 4)  Hydrocodone-homatropine 5-1.5 Mg/34ml Syrp (Hydrocodone-homatropine) .... 5 ml by mouth at bedtime 5)  Cyclobenzaprine Hcl 5 Mg Tabs (Cyclobenzaprine  hcl) .... One by mouth at bedtime   Orders Added: 1)  Radiology Referral [Radiology] 2)  Est. Patient Level IV [[address]]    Current Allergies (reviewed today): No known allergies

## 2011-01-01 NOTE — Letter (Signed)
Summary: Work Dietitian at [employer]. [address]   [place], [place] [address]   Phone: [phone]  Fax: [phone]      Today's Date: October 05, 2010    Name of Patient: Paige Wheeler [facility]  The above named patient had a medical visit today. Please take this into consideration when reviewing the time away from work.  Special Instructions:  [ X ] None  [  ] To be off the remainder of today, returning to the normal work / school schedule tomorrow.  [  ] To be off until the next scheduled appointment on ______________________.  [  ] Other ________________________________________________________________ ________________________________________________________________________     Sincerely yours,   Glendell Docker CMA Dr. Thomos Lemons

## 2011-01-01 NOTE — Assessment & Plan Note (Signed)
Summary: new to be est, ncpx- jr   Vital Signs:  Patient profile:   35 year old female Menstrual status:  regular LMP:     12/21/2009 Height:      65 inches Weight:      133.50 pounds BMI:     22.30 O2 Sat:      100 % on Room air Temp:     98.0 degrees F oral Pulse rate:   82 / minute Pulse rhythm:   regular Resp:     16 per minute BP sitting:   104 / 60  (right arm) Cuff size:   regular  Vitals Entered By: Glendell Docker CMA (January 08, 2010 1:35 PM)  O2 Flow:  Room air  Primary Care Provider:  D. Thomos Lemons DO  CC:  New Patient.  History of Present Illness: New Patient  35 y/o AA female for routine CPX.   Her mother works at [employer] in [place].  no chronic health problems.  no DM, htn, hyperlipidemia. wt is normal  abnormal PAP 3-4 yrs ago, negative colposcopy. f/u PAPs have been normal she has 35 y/o daughter  Preventive Screening-Counseling & Management  Alcohol-Tobacco     Alcohol drinks/day: 0     Smoking Status: never  Caffeine-Diet-Exercise     Caffeine use/day: 1 cup coffe daily     Does Patient Exercise: yes     Times/week: 3  Allergies (verified): No Known Drug Allergies  Past History:  Past Medical History: Allergic rhinitis - seasonal     Past Surgical History: Denies surgical history     Family History: Family History Diabetes 1st degree relative Family History Hypertension Family History of Prostate CA 1st degree relative <50 Mother - htn, DM II colon ca - no breast ca - no older brother - healthy  Social History: Never Smoked Alcohol use-no  Married - 8 yrs 3 y/o daughter 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   Head:  normocephalic and atraumatic.   Eyes:  pupils equal,  pupils round, and pupils reactive to light.   Ears:  R ear normal and L ear normal.   Mouth:  Oral mucosa and oropharynx without lesions or exudates.  Teeth in good repair. Neck:  No deformities, masses, or tenderness noted. Lungs:  normal respiratory effort, normal breath sounds, no crackles, and no wheezes.   Heart:  normal rate, regular rhythm, no murmur, and no gallop.   Abdomen:  soft, non-tender, normal bowel sounds, no masses, no hepatomegaly, and no splenomegaly.   Extremities:  No lower extremity edema  Neurologic:  cranial nerves II-XII intact and gait normal.   Psych:  memory intact for recent and remote, normally interactive, good eye contact, and not anxious appearing.     Impression & Recommendations:  Problem # 1:  PREVENTIVE HEALTH CARE (ICD-V70.0) Reviewed adult health maintenance protocols.  I suggest screening lipid panel, cbc.  continue annual PAPs with GYN.   Pap smear: normal (06/27/2009) Td Booster: Historical (04/22/2005)   Flu Vax: Declined (01/08/2010)     Patient Instructions: 1)  BMP prior to visit, ICD-9: 2)  Lipid Panel prior to visit, ICD-9: 3)  TSH prior to visit, ICD-9: 4)  CBC  prior to visit, ICD-9: 5)  HIV antibody:  6)  Please return for lab work within one week.   Preventive Care Screening  Pap Smear:    Date:  06/27/2009    Results:  normal   Last Tetanus Booster:    Date:  04/22/2005    Results:  Historical     Immunization History:  Influenza Immunization History:    Influenza:  declined (01/08/2010)   Contraindications/Deferment of Procedures/Staging:    Test/Procedure: FLU VAX    Reason for deferment: patient declined   Current Allergies (reviewed today): No known allergies

## 2011-01-01 NOTE — Letter (Signed)
   [place] [facility] [address] [place], [place]  [address]  [place] Phone: [phone]      January 17, 2010   [facility] [address] [place], [place] [address]  RE:  LAB RESULTS  Dear  Ms. Stephenson,  The following is an interpretation of your most recent lab tests.  Please take note of any instructions provided or changes to medications that have resulted from your lab work.  ELECTROLYTES:  Good - no changes needed  KIDNEY FUNCTION TESTS:  Good - no changes needed  LIVER FUNCTION TESTS:  Good - no changes needed  LIPID PANEL:  Good - no changes needed Triglyceride: 54   Cholesterol: 144   LDL: 70   HDL: 63   Chol/HDL%:  2.3 Ratio  THYROID STUDIES:  Thyroid studies normal TSH: 0.479     CBC:  Fair - review at your next visit   Your are slightly anemic.  I suggest you start over the counter iron supplement (Slow Fe) once daily.  When you follow up with your GYN for annual exam,  your complete blood count should be repeated.     Sincerely Yours,    Dr. Thomos Lemons

## 2011-01-01 NOTE — Assessment & Plan Note (Signed)
Summary: sore throat congestion fever x 2 days/mhf   Vital Signs:  Patient profile:   35 year old female Menstrual status:  regular Weight:      133.75 pounds BMI:     22.34 Temp:     97.9 degrees F oral Pulse rate:   88 / minute Pulse rhythm:   regular Resp:     16 per minute BP sitting:   100 / 62  (right arm) Cuff size:   regular  Vitals Entered By: Pearletha Furl CMA (January 22, 2010 10:39 AM) CC: Room 4  sore throat, URI symptoms Is Patient Diabetic? No Comments Body aches and sore throat Friday evening. Some sinus drainage. Productive cough.   Primary Care Provider:  Dondra Spry DO  CC:  Room 4  sore throat and URI symptoms.  History of Present Illness:  URI Symptoms      This is a 35 year old woman who presents with URI symptoms.  The symptoms began 3 days ago.  The patient reports productive cough, but denies earache.  The patient denies fever.    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   Ears:  R ear normal and L ear normal.   Mouth:  no exudates and pharyngeal erythema.   Lungs:  normal respiratory effort and normal breath sounds.   Heart:  normal rate, regular rhythm, and no gallop.     Impression & Recommendations:  Problem # 1:  URI (ICD-465.9)  Her updated medication list for this problem includes:    Alka-seltzer Plus Cold 2-7.8-325 Mg Tbef (Chlorphen-phenyleph-asa) ..... Once daily as needed  Instructed on symptomatic treatment. Call if symptoms persist or worsen.   Complete Medication List: 1)   Alka-seltzer Plus Cold 2-7.8-325 Mg Tbef (Chlorphen-phenyleph-asa) .... Once daily as needed 2)  Amoxicillin 875 Mg Tabs (Amoxicillin) .... One by mouth two times a day  Patient Instructions: 1)  Gargle with warm salt water two times a day 2)  Increase fluid intake 3)  Take tylenol or ibuprofen for aches and pains 4)  Call our office if your symptoms do not  improve or gets worse. Prescriptions: AMOXICILLIN 875 MG TABS (AMOXICILLIN) one by mouth two times a day  #14 x 0   Entered and Authorized by:   D. Thomos Lemons DO   Signed by:   D. Thomos Lemons DO on 01/22/2010   Method used:   Print then Give to Patient   RxID:   [id number]   Current Allergies (reviewed today): No known allergies

## 2011-01-01 NOTE — Assessment & Plan Note (Signed)
Summary: HEAD CONGESTION COUGH/MHF   Vital Signs:  Patient profile:   35 year old female Menstrual status:  regular Weight:      137.75 pounds BMI:     23.01 O2 Sat:      100 % on Room air Temp:     98.6 degrees F oral Pulse rate:   86 / minute Pulse rhythm:   regular Resp:     16 per minute BP sitting:   110 / 70  (right arm) Cuff size:   regular  Vitals Entered By: Glendell Docker CMA (February 16, 2010 3:53 PM)  O2 Flow:  Room air CC: Rm 3-   Primary Care Provider:  Dondra Spry DO  CC:  Rm 3-.  History of Present Illness: 35 y/o AA female c/o sinus pressure, pain and non stop post nasal gtt onset 3 days ago purulent nasal discharge cough worse at night  Allergies (verified): No Known Drug Allergies  Past History:  Past Medical History: Allergic rhinitis - seasonal       Past Surgical History: Denies surgical history      Family History: Family History Diabetes 1st degree relative Family History Hypertension Family History of Prostate CA 1st degree relative <50 Mother - htn, DM II colon ca - no breast ca - no older brother - healthy   Social History: Never Smoked Alcohol use-no  Married - 8 yrs   19 y/o daughter 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   Ears:  R ear normal and L ear normal.   Nose:  mucosal erythema and mucosal edema.   Mouth:  pharyngeal erythema.   Neck:  No deformities, masses, or tenderness noted. Lungs:  normal respiratory effort and normal breath sounds.   Heart:  normal rate, regular rhythm, and no gallop.     Impression & Recommendations:  Problem # 1:  RHINOSINUSITIS, ACUTE (ICD-461.8) Take abx as directed.  Patient advised to call office if symptoms persist or worsen.   Her updated medication list for this problem includes:    Cefuroxime Axetil 500 Mg Tabs  (Cefuroxime axetil) ..... One by mouth bid    Nasonex 50 Mcg/act Susp (Mometasone furoate) .Marland Kitchen... 2 sprays each nostri once daily    Hydrocodone-homatropine 5-1.5 Mg/61ml Syrp (Hydrocodone-homatropine) .Marland KitchenMarland KitchenMarland KitchenMarland Kitchen 5 ml by mouth at bedtime  Complete Medication List: 1)  Lo Loestrin Fe 1 Mg-10 Mcg / 10 Mcg Tabs (Norethin-eth estrad-fe biphas) .... Take 1 tablet by mouth once a day 2)  Cefuroxime Axetil 500 Mg Tabs (Cefuroxime axetil) .... One by mouth bid 3)  Nasonex 50 Mcg/act Susp (Mometasone furoate) .... 2 sprays each nostri once daily 4)  Hydrocodone-homatropine 5-1.5 Mg/24ml Syrp (Hydrocodone-homatropine) .... 5 ml by mouth at bedtime  Patient Instructions: 1)  Call our office if your symptoms do not  improve or gets worse. Prescriptions: HYDROCODONE-HOMATROPINE 5-1.5 MG/5ML SYRP (HYDROCODONE-HOMATROPINE) 5 ml by mouth at bedtime  #60 ml x 0   Entered and Authorized by:   D. Thomos Lemons DO   Signed by:   D. Thomos Lemons DO on 02/16/2010   Method used:   Print then Give to Patient   RxID:   [id number] NASONEX 50 MCG/ACT SUSP (MOMETASONE FUROATE) 2 sprays each nostri once daily  #1 x 5   Entered and Authorized by:   D. Thomos Lemons DO   Signed  by:   Dondra Spry DO on 02/16/2010   Method used:   Electronically to        [facility] [address] (retail)       [phone]       [place], [place]  [address]       Ph: [phone]       Fax: [phone]   RxID:   [id number] CEFUROXIME AXETIL 500 MG TABS (CEFUROXIME AXETIL) one by mouth bid  #20 x 0   Entered and Authorized by:   D. Thomos Lemons DO   Signed by:   D. Thomos Lemons DO on 02/16/2010   Method used:   Electronically to        [facility] [address] (retail)       [phone]       [place], [place]  [address]       Ph: [phone]       Fax: [phone]   RxID:   [id number]   Current Allergies (reviewed today): No known allergies

## 2011-02-19 ENCOUNTER — Encounter: Payer: Self-pay | Admitting: *Deleted

## 2011-02-19 ENCOUNTER — Ambulatory Visit (INDEPENDENT_AMBULATORY_CARE_PROVIDER_SITE_OTHER): Payer: BC Managed Care – PPO | Admitting: Internal Medicine

## 2011-02-19 ENCOUNTER — Encounter: Payer: Self-pay | Admitting: Internal Medicine

## 2011-02-19 VITALS — BP 112/70 | HR 88 | Temp 98.6°F | Resp 16 | Ht 65.0 in | Wt 143.0 lb

## 2011-02-19 DIAGNOSIS — M542 Cervicalgia: Secondary | ICD-10-CM | POA: Insufficient documentation

## 2011-02-19 MED ORDER — CYCLOBENZAPRINE HCL 5 MG PO TABS: 5.0000 mg | ORAL_TABLET | Freq: Two times a day (BID) | ORAL | Status: AC | PRN

## 2011-02-19 NOTE — Patient Instructions (Addendum)
Use ibuprofen 400-600 mg 2 x daily as needed for 1 week Please call our office if your symptoms do not improve or gets worse. Perform stretching exercises as directed. You can also try using moist warm compress 2 x daily as needed

## 2011-02-19 NOTE — Progress Notes (Signed)
  Subjective:    Patient ID: Paige Wheeler, female    DOB: 25-Jan-1976, 35 y.o.   MRN: [id number]  HPI 35 y/o female complains of neck pain Yesterday felt like she had "crook in her neck" Woke up this morning upper thoracic back pain Left arm feels "different" -  Describes as tingling sensation  Works in front of computer most of day.  She denies prev hx of neck injury.  No hx of trauma or injury  Any movement makes it worse Severity - 5 to 6 out of 10  Tried some over the counter analgesic 1000 mg with minimal improvement    Review of Systems No weakness    Objective:   Physical Exam  Constitutional: She appears well-developed and well-nourished.  Cardiovascular: Normal rate and regular rhythm.   Pulmonary/Chest: Effort normal and breath sounds normal.  Neurological: She has normal strength.  Reflex Scores:      Bicep reflexes are 1+ on the right side and 1+ on the left side.      Upper ext sensation is normal          Assessment & Plan:

## 2011-04-19 NOTE — H&P (Signed)
Paige Wheeler, WILCHER NO.:  [id number]   MEDICAL RECORD NO.:  [id number]                   PATIENT TYPE:  MAT   LOCATION:  MATC                                 [facility]   PHYSICIAN:  Lenoard Aden, M.D.             DATE OF BIRTH:  August 26, 1976   DATE OF ADMISSION:  01/19/2004  DATE OF DISCHARGE:                                HISTORY & PHYSICAL   CHIEF COMPLAINT:  Bleeding.   HISTORY OF PRESENT ILLNESS:  The patient is a 35 year old African American  female, G1, P0, EDD of March 07, 2004, at 33-1/7 weeks who presents with  acute onset of bright red blood per vagina this evening. She denies any  pain. She has no history of preterm labor.   ALLERGIES:  No known drug allergies.   MEDICATIONS:  Prenatal vitamins.   HISTORY OF PRESENT PREGNANCY:  The pregnancy history is noncontributory. Her  pregnancy course is uncomplicated to date.   GYNECOLOGIC HISTORY:  Remarkable for abnormal Pap smear with colposcopy.   LABORATORY DATA:  Prenatal laboratory data reveals a blood type of O  positive, rubella immune, hepatitis negative, rubella not immune, VDRL  nonreactive.   FAMILY HISTORY:  Remarkable for lung cancer and hypertension.   PHYSICAL EXAMINATION:  GENERAL:  She is a well appearing African American  female in no acute distress.  VITAL SIGNS:  Blood pressure 127/79, temperature 100.3, pulse 89,  respirations 20.  HEENT:  Normal.  LUNGS:  Clear.  HEART:  Regular rate and rhythm.  ABDOMEN:  Soft, gravid, 33 weeks and nontender.  PELVIC:  About 5 to 10 mL of dark blood in the vaginal vault. The cervix is  closed 2.5 to 3 cm long, vertex and -1.  EXTREMITIES:  No cords.  NEUROLOGIC:  Nonfocal.   IMPRESSION:  1. A 33-1/7 week intrauterine pregnancy.  2. Third trimester bleeding of unknown etiology. The patient has no history     of placenta previa. No clinical evidence of abruption. Fetal heart rate     reassuring at this time.   PLAN:   Admit, proceed with pregnancy induced hypertension panel, DIC panel,  CBC, hold clot, start IV, administer betamethasone 12.5 mg IM, repeat in 24  hours. Bedrest with bathroom  privileges only. Keep n.p.o. at this time.  Obtain complete obstetric ultrasound.                                               Lenoard Aden, M.D.    RJT/MEDQ  D:  01/19/2004  T:  01/19/2004  Job:  [id number]   cc:   Ma Hillock

## 2011-04-19 NOTE — Discharge Summary (Signed)
Paige Wheeler, Paige Wheeler NO.:  [id number]   MEDICAL RECORD NO.:  [id number]                   PATIENT TYPE:  INP   LOCATION:  9102                                 [facility]   PHYSICIAN:  Lenoard Aden, M.D.             DATE OF BIRTH:  12-31-1975   DATE OF ADMISSION:  01/19/2004  DATE OF DISCHARGE:  02/20/2004                                 DISCHARGE SUMMARY   HOSPITAL COURSE:  The patient was admitted at 33 weeks for third trimester  bleeding.  Presumed diagnosis of marginal abruption noted.  The patient  remained stable, minimal bleeding encountered on follow-up.  She was placed  on magnesium tocolysis for continued contractions, had some shortness of  breath with probable pulmonary edema.  Magnesium was discontinued, pulmonary  consult was obtained.  Diuresis was obtained.  The patient stabilized and  remained without symptoms for the remainder of her hospitalization.  She  remained without any evidence of bleeding and stable on tocolytic  medications.  Tocolytics were discontinued and the patient was electively  delivered at 37 weeks after which she underwent an uncomplicated vacuum  assisted vaginal delivery after cervical ripening.  She was discharged to  home on postpartum day #2, follow up in the office in 4-6 weeks.  Discharge  teaching done.                                               Lenoard Aden, M.D.    RJT/MEDQ  D:  03/30/2004  T:  03/30/2004  Job:  [id number]

## 2011-04-19 NOTE — Op Note (Signed)
NAMELESLEA, VOWLES                          ACCOUNT NO.:  [id number]   MEDICAL RECORD NO.:  [id number]                   PATIENT TYPE:  INP   LOCATION:  9102                                 [facility]   PHYSICIAN:  Lenoard Aden, M.D.             DATE OF BIRTH:  Aug 26, 1976   DATE OF PROCEDURE:  02/18/2004  DATE OF DISCHARGE:                                 OPERATIVE REPORT   PREOPERATIVE DIAGNOSES:  1. Thirty-seven-week intrauterine pregnancy.  2. Nonreassuring fetal heart rate tracing.   POSTOPERATIVE DIAGNOSES:  1. Thirty-seven-week intrauterine pregnancy.  2. Nonreassuring fetal heart rate tracing.   PROCEDURE:  Outlet vacuum-assisted vaginal delivery.   SURGEON:  Lenoard Aden, M.D.   ANESTHESIA:  Epidural.   ESTIMATED BLOOD LOSS:  500 mL.   COMPLICATIONS:  None.   BRIEF DESCRIPTION OF OPERATIVE PROCEDURE:  After being apprised of the risks  and benefits of a vacuum assistance with small incidence of cephalohematoma,  intracranial hemorrhage, and scalp lacerations, the fetal vertex was  confirmed to be at a +3 to +4 station, presumably straight OA with a large  amount of caput.  At this time a Kiwi cup was placed in proper location for  three pulls of a full-term living female which on delivery was straight  occiput posterior.  Apgars 9 and 9.  A second degree episiotomy with third  degree extension noted.  Placenta delivered spontaneously, intact.  Three-  vessel cord and succenturiate lobe noted.  At this time the cervix was  inspected and found to be hemostatic.  Episiotomy was closed using a 3-0  Rapide and a 0 Monocryl in the usual sterile fashion with interrupted  closure of the external anal sphincter muscle.  At this time good hemostasis  was noted.  All instruments were removed.  No lacerations were noted, no  complications.  Mother and baby doing well and recovering in good condition.                                               Lenoard Aden, M.D.    RJT/MEDQ  D:  02/18/2004  T:  02/20/2004  Job:  [id number]

## 2014-01-26 ENCOUNTER — Encounter: Payer: Self-pay | Admitting: Physician Assistant

## 2014-01-26 ENCOUNTER — Ambulatory Visit (INDEPENDENT_AMBULATORY_CARE_PROVIDER_SITE_OTHER): Payer: BC Managed Care – PPO | Admitting: Physician Assistant

## 2014-01-26 VITALS — BP 108/70 | HR 97 | Temp 98.5°F | Resp 14 | Ht 65.0 in | Wt 150.2 lb

## 2014-01-26 DIAGNOSIS — Z7189 Other specified counseling: Secondary | ICD-10-CM

## 2014-01-26 DIAGNOSIS — J209 Acute bronchitis, unspecified: Secondary | ICD-10-CM

## 2014-01-26 DIAGNOSIS — Z7689 Persons encountering health services in other specified circumstances: Secondary | ICD-10-CM

## 2014-01-26 MED ORDER — AZITHROMYCIN 250 MG PO TABS: ORAL_TABLET | ORAL | Status: DC

## 2014-01-26 MED ORDER — HYDROCODONE-HOMATROPINE 5-1.5 MG/5ML PO SYRP: 5.0000 mL | ORAL_SOLUTION | Freq: Three times a day (TID) | ORAL | Status: DC | PRN

## 2014-01-26 NOTE — Progress Notes (Signed)
Pre visit review using our clinic review tool, if applicable. No additional management support is needed unless otherwise documented below in the visit note/SLS  

## 2014-01-26 NOTE — Patient Instructions (Signed)
Take antibiotic as prescribed.  Take Hycodan as directed. Increase fluid intake.  Rest.  Saline nasal spray. Mucinex. Humidifier in bedroom. Please call or return to clinic if symptoms are not improving.   Please schedule a complete physical exam with fasting labs.   Bronchitis Bronchitis is inflammation of the airways that extend from the windpipe into the lungs (bronchi). The inflammation often causes mucus to develop, which leads to a cough. If the inflammation becomes severe, it may cause shortness of breath. CAUSES  Bronchitis may be caused by:   Viral infections.   Bacteria.   Cigarette smoke.   Allergens, pollutants, and other irritants.  SIGNS AND SYMPTOMS  The most common symptom of bronchitis is a frequent cough that produces mucus. Other symptoms include:  Fever.   Body aches.   Chest congestion.   Chills.   Shortness of breath.   Sore throat.  DIAGNOSIS  Bronchitis is usually diagnosed through a medical history and physical exam. Tests, such as chest X-rays, are sometimes done to rule out other conditions.  TREATMENT  You may need to avoid contact with whatever caused the problem (smoking, for example). Medicines are sometimes needed. These may include:  Antibiotics. These may be prescribed if the condition is caused by bacteria.  Cough suppressants. These may be prescribed for relief of cough symptoms.   Inhaled medicines. These may be prescribed to help open your airways and make it easier for you to breathe.   Steroid medicines. These may be prescribed for those with recurrent (chronic) bronchitis. HOME CARE INSTRUCTIONS  Get plenty of rest.   Drink enough fluids to keep your urine clear or pale yellow (unless you have a medical condition that requires fluid restriction). Increasing fluids may help thin your secretions and will prevent dehydration.   Only take over-the-counter or prescription medicines as directed by your health care  provider.  Only take antibiotics as directed. Make sure you finish them even if you start to feel better.  Avoid secondhand smoke, irritating chemicals, and strong fumes. These will make bronchitis worse. If you are a smoker, quit smoking. Consider using nicotine gum or skin patches to help control withdrawal symptoms. Quitting smoking will help your lungs heal faster.   Put a cool-mist humidifier in your bedroom at night to moisten the air. This may help loosen mucus. Change the water in the humidifier daily. You can also run the hot water in your shower and sit in the bathroom with the door closed for 5 10 minutes.   Follow up with your health care provider as directed.   Wash your hands frequently to avoid catching bronchitis again or spreading an infection to others.  SEEK MEDICAL CARE IF: Your symptoms do not improve after 1 week of treatment.  SEEK IMMEDIATE MEDICAL CARE IF:  Your fever increases.  You have chills.   You have chest pain.   You have worsening shortness of breath.   You have bloody sputum.  You faint.  You have lightheadedness.  You have a severe headache.   You vomit repeatedly. MAKE SURE YOU:   Understand these instructions.  Will watch your condition.  Will get help right away if you are not doing well or get worse. Document Released: 11/18/2005 Document Revised: 09/08/2013 Document Reviewed: 07/13/2013 [facility] Patient Information 2014 [place].

## 2014-01-26 NOTE — Progress Notes (Signed)
Patient presents to clinic today to establish care.  Acute Concerns: Patient c/o productive cough, chest congestion, nasal congestion and fatigue x 2 weeks. Patient is unsure of fever. Denies chills or aches.  Denies pleuritic chest pain.  Cough is keeping her up at night.  Chronic Issues: Allergic Rhinitis -- seasonal.  Occasional claritin or zyrtec with good relief of symptoms.  Health Maintenance: Dental -- overdue Vision -- overdue Immunizations -- Declines flu shot.  Tetanus shot in 2006 Mammogram -- never had; due at 40 PAP -- UTD, Dr. Si Raider -- [facility], [facility]  History reviewed. No pertinent past medical history.  Past Surgical History  Procedure Laterality Date  . Unremarkable.      No current outpatient prescriptions on file prior to visit.   No current facility-administered medications on file prior to visit.    No Known Allergies  Family History  Problem Relation Age of Onset  . Hypertension Mother     Living  . Hypertension Father     Living  . Alzheimer's disease Paternal Grandmother   . Lung cancer Maternal Grandmother   . Lung cancer Maternal Grandfather   . Hypertension Brother   . Hyperlipidemia Brother   . Allergies Daughter     Environmental  . Asthma Daughter     History   Social History  . Marital Status: Married    Spouse Name: N/A    Number of Children: N/A  . Years of Education: N/A   Occupational History  . Not on file.   Social History Main Topics  . Smoking status: Never Smoker   . Smokeless tobacco: Never Used  . Alcohol Use: No  . Drug Use: No  . Sexual Activity: Yes    Birth Control/ Protection: None     Comment: married -   Other Topics Concern  . Not on file   Social History Narrative  . No narrative on file    Review of Systems  Constitutional: Negative for fever and weight loss.  HENT: Positive for ear pain. Negative for hearing loss and tinnitus.   Eyes: Negative for blurred vision, double vision,  photophobia and pain.  Respiratory: Positive for cough and sputum production. Negative for hemoptysis, shortness of breath and wheezing.   Cardiovascular: Negative for chest pain and palpitations.  Gastrointestinal: Negative for heartburn, nausea, vomiting, abdominal pain, diarrhea, constipation, blood in stool and melena.  Genitourinary: Negative for dysuria, urgency, frequency, hematuria and flank pain.  Neurological: Negative for dizziness, seizures, loss of consciousness and headaches.  Endo/Heme/Allergies: Negative for environmental allergies.  Psychiatric/Behavioral: Negative for depression, suicidal ideas, hallucinations and substance abuse. The patient is not nervous/anxious and does not have insomnia.     BP 108/70  Pulse 97  Temp(Src) 98.5 F (36.9 C) (Oral)  Resp 14  Ht 5' 5"  (1.651 m)  Wt 150 lb 4 oz (68.153 kg)  BMI 25.00 kg/m2  SpO2 98%  LMP 01/24/2014  Physical Exam  Vitals reviewed. Constitutional: She is oriented to person, place, and time and well-developed, well-nourished, and in no distress.  HENT:  Head: Normocephalic and atraumatic.  Right Ear: External ear normal.  Left Ear: External ear normal.  Nose: Nose normal.  Mouth/Throat: Oropharynx is clear and moist. No oropharyngeal exudate.  TM within normal limits bilaterally.  No TTP of sinuses noted on examination.  Eyes: Conjunctivae are normal. Pupils are equal, round, and reactive to light.  Neck: Neck supple.  Cardiovascular: Normal rate, regular rhythm and normal heart sounds.   Pulmonary/Chest:  Effort normal and breath sounds normal. No respiratory distress. She has no wheezes. She has no rales. She exhibits no tenderness.  Lymphadenopathy:    She has no cervical adenopathy.  Neurological: She is alert and oriented to person, place, and time.  Skin: Skin is warm and dry. No rash noted.  Psychiatric: Affect normal.   Assessment/Plan: Acute bronchitis Rx Azithromycin. Rx hycodan syrup. Increase  fluid intake.  Rest.  Saline nasal spray. Mucinex. Humidifier in bedroom.  Please call or return to clinic if symptoms are not improving.   Encounter to establish care Medical history reviewed and updated.  Patient declines flu shot.  Is due for repeat Tetanus in 1 year.  Followed by OB/GYN for routine gynecological care.  Patient to schedule appointment for CPE with fasting labs.

## 2014-01-28 DIAGNOSIS — J209 Acute bronchitis, unspecified: Secondary | ICD-10-CM | POA: Insufficient documentation

## 2014-01-28 DIAGNOSIS — Z7689 Persons encountering health services in other specified circumstances: Secondary | ICD-10-CM | POA: Insufficient documentation

## 2014-01-28 NOTE — Assessment & Plan Note (Addendum)
Rx Azithromycin. Rx hycodan syrup. Increase fluid intake.  Rest.  Saline nasal spray. Mucinex. Humidifier in bedroom.  Please call or return to clinic if symptoms are not improving.

## 2014-01-28 NOTE — Assessment & Plan Note (Signed)
Medical history reviewed and updated.  Patient declines flu shot.  Is due for repeat Tetanus in 1 year.  Followed by OB/GYN for routine gynecological care.  Patient to schedule appointment for CPE with fasting labs.

## 2014-02-09 ENCOUNTER — Encounter: Payer: BC Managed Care – PPO | Admitting: Physician Assistant

## 2014-03-02 ENCOUNTER — Encounter: Payer: Self-pay | Admitting: Physician Assistant

## 2014-03-02 ENCOUNTER — Ambulatory Visit (INDEPENDENT_AMBULATORY_CARE_PROVIDER_SITE_OTHER): Payer: BC Managed Care – PPO | Admitting: Physician Assistant

## 2014-03-02 VITALS — BP 100/68 | HR 85 | Temp 98.6°F | Resp 16 | Ht 65.0 in | Wt 149.0 lb

## 2014-03-02 DIAGNOSIS — Z0000 Encounter for general adult medical examination without abnormal findings: Secondary | ICD-10-CM | POA: Insufficient documentation

## 2014-03-02 LAB — CBC WITH DIFFERENTIAL/PLATELET
BASOS PCT: 1 % (ref 0–1)
Basophils Absolute: 0.1 10*3/uL (ref 0.0–0.1)
EOS ABS: 0.1 10*3/uL (ref 0.0–0.7)
EOS PCT: 2 % (ref 0–5)
HCT: 38.3 % (ref 36.0–46.0)
HEMOGLOBIN: 12.3 g/dL (ref 12.0–15.0)
LYMPHS ABS: 1.6 10*3/uL (ref 0.7–4.0)
Lymphocytes Relative: 31 % (ref 12–46)
MCH: 26.5 pg (ref 26.0–34.0)
MCHC: 32.1 g/dL (ref 30.0–36.0)
MCV: 82.4 fL (ref 78.0–100.0)
MONOS PCT: 9 % (ref 3–12)
Monocytes Absolute: 0.5 10*3/uL (ref 0.1–1.0)
NEUTROS PCT: 57 % (ref 43–77)
Neutro Abs: 3 10*3/uL (ref 1.7–7.7)
PLATELETS: 244 10*3/uL (ref 150–400)
RBC: 4.65 MIL/uL (ref 3.87–5.11)
RDW: 14.8 % (ref 11.5–15.5)
WBC: 5.3 10*3/uL (ref 4.0–10.5)

## 2014-03-02 LAB — BASIC METABOLIC PANEL WITH GFR
BUN: 12 mg/dL (ref 6–23)
CALCIUM: 8.8 mg/dL (ref 8.4–10.5)
CO2: 26 meq/L (ref 19–32)
CREATININE: 0.8 mg/dL (ref 0.50–1.10)
Chloride: 105 mEq/L (ref 96–112)
GFR, Est African American: >89 mL/min
Glucose, Bld: 86 mg/dL (ref 70–99)
POTASSIUM: 4.1 meq/L (ref 3.5–5.3)
SODIUM: 139 meq/L (ref 135–145)

## 2014-03-02 LAB — LIPID PANEL
Cholesterol: 165 mg/dL (ref 0–200)
HDL: 65 mg/dL (ref >39)
LDL CALC: 89 mg/dL (ref 0–99)
Total CHOL/HDL Ratio: 2.5 Ratio
Triglycerides: 55 mg/dL (ref <150)
VLDL: 11 mg/dL (ref 0–40)

## 2014-03-02 LAB — HEPATIC FUNCTION PANEL
ALK PHOS: 51 U/L (ref 39–117)
ALT: 13 U/L (ref 0–35)
AST: 18 U/L (ref 0–37)
Albumin: 4.3 g/dL (ref 3.5–5.2)
BILIRUBIN DIRECT: 0.1 mg/dL (ref 0.0–0.3)
BILIRUBIN TOTAL: 0.4 mg/dL (ref 0.2–1.2)
Indirect Bilirubin: 0.3 mg/dL (ref 0.2–1.2)
Total Protein: 7 g/dL (ref 6.0–8.3)

## 2014-03-02 LAB — HEMOGLOBIN A1C
HEMOGLOBIN A1C: 5.5 % (ref <5.7)
MEAN PLASMA GLUCOSE: 111 mg/dL (ref <117)

## 2014-03-02 NOTE — Progress Notes (Signed)
Patient presents to clinic today for annual exam.  Patient also getting labs.  Acute Concerns: No concerns at today's visit.  Chronic Issues: Allergic rhinitis -- seasonal.  Controlled with occasional OTC medication.  Health Maintenance: Dental -- overdue Vision -- overdue Immunizations -- Declines flu shot.  Tetanus UTD PAP -- UTD; sees [facility]  No past medical history on file.  Past Surgical History  Procedure Laterality Date  . Unremarkable.      Current Outpatient Prescriptions on File Prior to Visit  Medication Sig Dispense Refill  . NON FORMULARY Emergen-C Vitamin C liquid       No current facility-administered medications on file prior to visit.    No Known Allergies  Family History  Problem Relation Age of Onset  . Hypertension Mother     Living  . Hypertension Father     Living  . Alzheimer's disease Paternal Grandmother   . Lung cancer Maternal Grandmother   . Lung cancer Maternal Grandfather   . Hypertension Brother   . Hyperlipidemia Brother   . Allergies Daughter     Environmental  . Asthma Daughter     History   Social History  . Marital Status: Married    Spouse Name: N/A    Number of Children: N/A  . Years of Education: N/A   Occupational History  . Not on file.   Social History Main Topics  . Smoking status: Never Smoker   . Smokeless tobacco: Never Used  . Alcohol Use: No  . Drug Use: No  . Sexual Activity: Yes    Birth Control/ Protection: None     Comment: married -   Other Topics Concern  . Not on file   Social History Narrative  . No narrative on file   Review of Systems  Constitutional: Negative for fever and weight loss.  HENT: Negative for ear discharge, ear pain, hearing loss and tinnitus.   Eyes: Negative for blurred vision, double vision, photophobia and pain.  Respiratory: Negative for cough, shortness of breath and wheezing.   Cardiovascular: Negative for chest pain and palpitations.  Gastrointestinal:  Negative for heartburn, nausea, vomiting, abdominal pain, diarrhea, constipation, blood in stool and melena.  Genitourinary: Negative for dysuria, urgency, frequency, hematuria and flank pain.  Musculoskeletal: Negative for back pain and neck pain.  Neurological: Negative for dizziness, seizures and headaches.  Endo/Heme/Allergies: Positive for environmental allergies.  Psychiatric/Behavioral: Negative for depression, suicidal ideas, hallucinations and substance abuse. The patient is not nervous/anxious and does not have insomnia.    BP 100/68  Pulse 85  Temp(Src) 98.6 F (37 C) (Oral)  Resp 16  Ht 5' 5"  (1.651 m)  Wt 149 lb (67.586 kg)  BMI 24.79 kg/m2  SpO2 99%  LMP 02/18/2014  Physical Exam  Vitals reviewed. Constitutional: She is oriented to person, place, and time and well-developed, well-nourished, and in no distress.  HENT:  Head: Normocephalic and atraumatic.  Right Ear: External ear normal.  Left Ear: External ear normal.  Nose: Nose normal.  Mouth/Throat: Oropharynx is clear and moist. No oropharyngeal exudate.  TM within normal limits bilaterally.  Eyes: Conjunctivae are normal. Pupils are equal, round, and reactive to light.  Neck: Neck supple.  Cardiovascular: Normal rate, regular rhythm, normal heart sounds and intact distal pulses.   Pulmonary/Chest: Effort normal and breath sounds normal. No respiratory distress. She has no wheezes. She has no rales. She exhibits no tenderness.  Abdominal: Soft. Bowel sounds are normal. She exhibits no distension and no  mass. There is no tenderness. There is no rebound and no guarding.  Musculoskeletal: Normal range of motion.  Lymphadenopathy:    She has no cervical adenopathy.  Neurological: She is alert and oriented to person, place, and time. No cranial nerve deficit.  Skin: Skin is warm and dry. No rash noted.  Psychiatric: Affect normal.   Assessment/Plan: Visit for preventive health examination Medical history reviewed  and updated.  Tetanus UTD.  Denies flu shot.  Followed by OB/GYN.  Will obtain fasting labs.  Follow-up in 1 year.  Return sooner as needed.  Will schedule earlier follow-up if indicated by lab results.

## 2014-03-02 NOTE — Assessment & Plan Note (Signed)
Medical history reviewed and updated.  Tetanus UTD.  Denies flu shot.  Followed by OB/GYN.  Will obtain fasting labs.  Follow-up in 1 year.  Return sooner as needed.  Will schedule earlier follow-up if indicated by lab results.

## 2014-03-02 NOTE — Patient Instructions (Signed)
I will call you with your lab results.  For ear wax buildup -- put a capful of hydrogen peroxide in the ear and let it bubble; then let it drain from ear.  You can also get some OTC earwax removal kits.  If symptoms are persistent, return to clinic for irrigation of your ear canals.  Preventive Care for Adults, Female A healthy lifestyle and preventive care can promote health and wellness. Preventive health guidelines for women include the following key practices.  A routine yearly physical is a good way to check with your health care provider about your health and preventive screening. It is a chance to share any concerns and updates on your health and to receive a thorough exam.  Visit your dentist for a routine exam and preventive care every 6 months. Brush your teeth twice a day and floss once a day. Good oral hygiene prevents tooth decay and gum disease.  The frequency of eye exams is based on your age, health, family medical history, use of contact lenses, and other factors. Follow your health care provider's recommendations for frequency of eye exams.  Eat a healthy diet. Foods like vegetables, fruits, whole grains, low-fat dairy products, and lean protein foods contain the nutrients you need without too many calories. Decrease your intake of foods high in solid fats, added sugars, and salt. Eat the right amount of calories for you.Get information about a proper diet from your health care provider, if necessary.  Regular physical exercise is one of the most important things you can do for your health. Most adults should get at least 150 minutes of moderate-intensity exercise (any activity that increases your heart rate and causes you to sweat) each week. In addition, most adults need muscle-strengthening exercises on 2 or more days a week.  Maintain a healthy weight. The body mass index (BMI) is a screening tool to identify possible weight problems. It provides an estimate of body fat based on  height and weight. Your health care provider can find your BMI, and can help you achieve or maintain a healthy weight.For adults 20 years and older:  A BMI below 18.5 is considered underweight.  A BMI of 18.5 to 24.9 is normal.  A BMI of 25 to 29.9 is considered overweight.  A BMI of 30 and above is considered obese.  Maintain normal blood lipids and cholesterol levels by exercising and minimizing your intake of saturated fat. Eat a balanced diet with plenty of fruit and vegetables. Blood tests for lipids and cholesterol should begin at age 73 and be repeated every 5 years. If your lipid or cholesterol levels are high, you are over 50, or you are at high risk for heart disease, you may need your cholesterol levels checked more frequently.Ongoing high lipid and cholesterol levels should be treated with medicines if diet and exercise are not working.  If you smoke, find out from your health care provider how to quit. If you do not use tobacco, do not start.  Lung cancer screening is recommended for adults aged 22 80 years who are at high risk for developing lung cancer because of a history of smoking. A yearly low-dose CT scan of the lungs is recommended for people who have at least a 30-pack-year history of smoking and are a current smoker or have quit within the past 15 years. A pack year of smoking is smoking an average of 1 pack of cigarettes a day for 1 year (for example: 1 pack a day  for 30 years or 2 packs a day for 15 years). Yearly screening should continue until the smoker has stopped smoking for at least 15 years. Yearly screening should be stopped for people who develop a health problem that would prevent them from having lung cancer treatment.  If you are pregnant, do not drink alcohol. If you are breastfeeding, be very cautious about drinking alcohol. If you are not pregnant and choose to drink alcohol, do not have more than 1 drink per day. One drink is considered to be 12 ounces (355  mL) of beer, 5 ounces (148 mL) of wine, or 1.5 ounces (44 mL) of liquor.  Avoid use of street drugs. Do not share needles with anyone. Ask for help if you need support or instructions about stopping the use of drugs.  High blood pressure causes heart disease and increases the risk of stroke. Your blood pressure should be checked at least every 1 to 2 years. Ongoing high blood pressure should be treated with medicines if weight loss and exercise do not work.  If you are 72 38 years old, ask your health care provider if you should take aspirin to prevent strokes.  Diabetes screening involves taking a blood sample to check your fasting blood sugar level. This should be done once every 3 years, after age 23, if you are within normal weight and without risk factors for diabetes. Testing should be considered at a younger age or be carried out more frequently if you are overweight and have at least 1 risk factor for diabetes.  Breast cancer screening is essential preventive care for women. You should practice "breast self-awareness." This means understanding the normal appearance and feel of your breasts and may include breast self-examination. Any changes detected, no matter how small, should be reported to a health care provider. Women in their 42s and 30s should have a clinical breast exam (CBE) by a health care provider as part of a regular health exam every 1 to 3 years. After age 42, women should have a CBE every year. Starting at age 36, women should consider having a mammogram (breast X-ray test) every year. Women who have a family history of breast cancer should talk to their health care provider about genetic screening. Women at a high risk of breast cancer should talk to their health care providers about having an MRI and a mammogram every year.  Breast cancer gene (BRCA)-related cancer risk assessment is recommended for women who have family members with BRCA-related cancers. BRCA-related cancers  include breast, ovarian, tubal, and peritoneal cancers. Having family members with these cancers may be associated with an increased risk for harmful changes (mutations) in the breast cancer genes BRCA1 and BRCA2. Results of the assessment will determine the need for genetic counseling and BRCA1 and BRCA2 testing.  The Pap test is a screening test for cervical cancer. A Pap test can show cell changes on the cervix that might become cervical cancer if left untreated. A Pap test is a procedure in which cells are obtained and examined from the lower end of the uterus (cervix).  Women should have a Pap test starting at age 57.  Between ages 22 and 42, Pap tests should be repeated every 2 years.  Beginning at age 40, you should have a Pap test every 3 years as long as the past 3 Pap tests have been normal.  Some women have medical problems that increase the chance of getting cervical cancer. Talk to your health care  provider about these problems. It is especially important to talk to your health care provider if a new problem develops soon after your last Pap test. In these cases, your health care provider may recommend more frequent screening and Pap tests.  The above recommendations are the same for women who have or have not gotten the vaccine for human papillomavirus (HPV).  If you had a hysterectomy for a problem that was not cancer or a condition that could lead to cancer, then you no longer need Pap tests. Even if you no longer need a Pap test, a regular exam is a good idea to make sure no other problems are starting.  If you are between ages 55 and 36 years, and you have had normal Pap tests going back 10 years, you no longer need Pap tests. Even if you no longer need a Pap test, a regular exam is a good idea to make sure no other problems are starting.  If you have had past treatment for cervical cancer or a condition that could lead to cancer, you need Pap tests and screening for cancer for at  least 20 years after your treatment.  If Pap tests have been discontinued, risk factors (such as a new sexual partner) need to be reassessed to determine if screening should be resumed.  The HPV test is an additional test that may be used for cervical cancer screening. The HPV test looks for the virus that can cause the cell changes on the cervix. The cells collected during the Pap test can be tested for HPV. The HPV test could be used to screen women aged 17 years and older, and should be used in women of any age who have unclear Pap test results. After the age of 35, women should have HPV testing at the same frequency as a Pap test.  Colorectal cancer can be detected and often prevented. Most routine colorectal cancer screening begins at the age of 71 years and continues through age 45 years. However, your health care provider may recommend screening at an earlier age if you have risk factors for colon cancer. On a yearly basis, your health care provider may provide home test kits to check for hidden blood in the stool. Use of a small camera at the end of a tube, to directly examine the colon (sigmoidoscopy or colonoscopy), can detect the earliest forms of colorectal cancer. Talk to your health care provider about this at age 8, when routine screening begins. Direct exam of the colon should be repeated every 5 10 years through age 41 years, unless early forms of pre-cancerous polyps or small growths are found.  People who are at an increased risk for hepatitis B should be screened for this virus. You are considered at high risk for hepatitis B if:  You were born in a country where hepatitis B occurs often. Talk with your health care provider about which countries are considered high risk.  Your parents were born in a high-risk country and you have not received a shot to protect against hepatitis B (hepatitis B vaccine).  You have HIV or AIDS.  You use needles to inject street drugs.  You live  with, or have sex with, someone who has Hepatitis B.  You get hemodialysis treatment.  You take certain medicines for conditions like cancer, organ transplantation, and autoimmune conditions.  Hepatitis C blood testing is recommended for all people born from [birth year] through 1965 and any individual with known risks for hepatitis  C.  Practice safe sex. Use condoms and avoid high-risk sexual practices to reduce the spread of sexually transmitted infections (STIs). STIs include gonorrhea, chlamydia, syphilis, trichomonas, herpes, HPV, and human immunodeficiency virus (HIV). Herpes, HIV, and HPV are viral illnesses that have no cure. They can result in disability, cancer, and death. Sexually active women aged 42 years and younger should be checked for chlamydia. Older women with new or multiple partners should also be tested for chlamydia. Testing for other STIs is recommended if you are sexually active and at increased risk.  Osteoporosis is a disease in which the bones lose minerals and strength with aging. This can result in serious bone fractures or breaks. The risk of osteoporosis can be identified using a bone density scan. Women ages 72 years and over and women at risk for fractures or osteoporosis should discuss screening with their health care providers. Ask your health care provider whether you should take a calcium supplement or vitamin D to reduce the rate of osteoporosis.  Menopause can be associated with physical symptoms and risks. Hormone replacement therapy is available to decrease symptoms and risks. You should talk to your health care provider about whether hormone replacement therapy is right for you.  Use sunscreen. Apply sunscreen liberally and repeatedly throughout the day. You should seek shade when your shadow is shorter than you. Protect yourself by wearing long sleeves, pants, a wide-brimmed hat, and sunglasses year round, whenever you are outdoors.  Once a month, do a whole body  skin exam, using a mirror to look at the skin on your back. Tell your health care provider of new moles, moles that have irregular borders, moles that are larger than a pencil eraser, or moles that have changed in shape or color.  Stay current with required vaccines (immunizations).  Influenza vaccine. All adults should be immunized every year.  Tetanus, diphtheria, and acellular pertussis (Td, Tdap) vaccine. Pregnant women should receive 1 dose of Tdap vaccine during each pregnancy. The dose should be obtained regardless of the length of time since the last dose. Immunization is preferred during the 27th 36th week of gestation. An adult who has not previously received Tdap or who does not know her vaccine status should receive 1 dose of Tdap. This initial dose should be followed by tetanus and diphtheria toxoids (Td) booster doses every 10 years. Adults with an unknown or incomplete history of completing a 3-dose immunization series with Td-containing vaccines should begin or complete a primary immunization series including a Tdap dose. Adults should receive a Td booster every 10 years.  Varicella vaccine. An adult without evidence of immunity to varicella should receive 2 doses or a second dose if she has previously received 1 dose. Pregnant females who do not have evidence of immunity should receive the first dose after pregnancy. This first dose should be obtained before leaving the health care facility. The second dose should be obtained 4 8 weeks after the first dose.  Human papillomavirus (HPV) vaccine. Females aged 4 26 years who have not received the vaccine previously should obtain the 3-dose series. The vaccine is not recommended for use in pregnant females. However, pregnancy testing is not needed before receiving a dose. If a female is found to be pregnant after receiving a dose, no treatment is needed. In that case, the remaining doses should be delayed until after the pregnancy.  Immunization is recommended for any person with an immunocompromised condition through the age of 19 years if she did not  get any or all doses earlier. During the 3-dose series, the second dose should be obtained 4 8 weeks after the first dose. The third dose should be obtained 24 weeks after the first dose and 16 weeks after the second dose.  Zoster vaccine. One dose is recommended for adults aged 76 years or older unless certain conditions are present.  Measles, mumps, and rubella (MMR) vaccine. Adults born before [birth year] generally are considered immune to measles and mumps. Adults born in [birth year] or later should have 1 or more doses of MMR vaccine unless there is a contraindication to the vaccine or there is laboratory evidence of immunity to each of the three diseases. A routine second dose of MMR vaccine should be obtained at least 28 days after the first dose for students attending postsecondary schools, health care workers, or international travelers. People who received inactivated measles vaccine or an unknown type of measles vaccine during 1963 1967 should receive 2 doses of MMR vaccine. People who received inactivated mumps vaccine or an unknown type of mumps vaccine before 1979 and are at high risk for mumps infection should consider immunization with 2 doses of MMR vaccine. For females of childbearing age, rubella immunity should be determined. If there is no evidence of immunity, females who are not pregnant should be vaccinated. If there is no evidence of immunity, females who are pregnant should delay immunization until after pregnancy. Unvaccinated health care workers born before [birth year] who lack laboratory evidence of measles, mumps, or rubella immunity or laboratory confirmation of disease should consider measles and mumps immunization with 2 doses of MMR vaccine or rubella immunization with 1 dose of MMR vaccine.  Pneumococcal 13-valent conjugate (PCV13) vaccine. When indicated, a person who is  uncertain of her immunization history and has no record of immunization should receive the PCV13 vaccine. An adult aged 40 years or older who has certain medical conditions and has not been previously immunized should receive 1 dose of PCV13 vaccine. This PCV13 should be followed with a dose of pneumococcal polysaccharide (PPSV23) vaccine. The PPSV23 vaccine dose should be obtained at least 8 weeks after the dose of PCV13 vaccine. An adult aged 42 years or older who has certain medical conditions and previously received 1 or more doses of PPSV23 vaccine should receive 1 dose of PCV13. The PCV13 vaccine dose should be obtained 1 or more years after the last PPSV23 vaccine dose.  Pneumococcal polysaccharide (PPSV23) vaccine. When PCV13 is also indicated, PCV13 should be obtained first. All adults aged 28 years and older should be immunized. An adult younger than age 43 years who has certain medical conditions should be immunized. Any person who resides in a nursing home or long-term care facility should be immunized. An adult smoker should be immunized. People with an immunocompromised condition and certain other conditions should receive both PCV13 and PPSV23 vaccines. People with human immunodeficiency virus (HIV) infection should be immunized as soon as possible after diagnosis. Immunization during chemotherapy or radiation therapy should be avoided. Routine use of PPSV23 vaccine is not recommended for American Indians, [place] Natives, or people younger than 65 years unless there are medical conditions that require PPSV23 vaccine. When indicated, people who have unknown immunization and have no record of immunization should receive PPSV23 vaccine. One-time revaccination 5 years after the first dose of PPSV23 is recommended for people aged 24 64 years who have chronic kidney failure, nephrotic syndrome, asplenia, or immunocompromised conditions. People who received 1 2 doses of PPSV23 before age  65 years should  receive another dose of PPSV23 vaccine at age 36 years or later if at least 5 years have passed since the previous dose. Doses of PPSV23 are not needed for people immunized with PPSV23 at or after age 34 years.  Meningococcal vaccine. Adults with asplenia or persistent complement component deficiencies should receive 2 doses of quadrivalent meningococcal conjugate (MenACWY-D) vaccine. The doses should be obtained at least 2 months apart. Microbiologists working with certain meningococcal bacteria, [place] recruits, people at risk during an outbreak, and people who travel to or live in countries with a high rate of meningitis should be immunized. A first-year college student up through age 45 years who is living in a residence hall should receive a dose if she did not receive a dose on or after her 16th birthday. Adults who have certain high-risk conditions should receive one or more doses of vaccine.  Hepatitis A vaccine. Adults who wish to be protected from this disease, have certain high-risk conditions, work with hepatitis A-infected animals, work in hepatitis A research labs, or travel to or work in countries with a high rate of hepatitis A should be immunized. Adults who were previously unvaccinated and who anticipate close contact with an international adoptee during the first 60 days after arrival in the [place] from a country with a high rate of hepatitis A should be immunized.  Hepatitis B vaccine. Adults who wish to be protected from this disease, have certain high-risk conditions, may be exposed to blood or other infectious body fluids, are household contacts or sex partners of hepatitis B positive people, are clients or workers in certain care facilities, or travel to or work in countries with a high rate of hepatitis B should be immunized.  Haemophilus influenzae type b (Hib) vaccine. A previously unvaccinated person with asplenia or sickle cell disease or having a scheduled splenectomy  should receive 1 dose of Hib vaccine. Regardless of previous immunization, a recipient of a hematopoietic stem cell transplant should receive a 3-dose series 6 12 months after her successful transplant. Hib vaccine is not recommended for adults with HIV infection. Preventive Services / Frequency Ages 23 to 39years  Blood pressure check.** / Every 1 to 2 years.  Lipid and cholesterol check.** / Every 5 years beginning at age 58.  Clinical breast exam.** / Every 3 years for women in their 82s and 22s.  BRCA-related cancer risk assessment.** / For women who have family members with a BRCA-related cancer (breast, ovarian, tubal, or peritoneal cancers).  Pap test.** / Every 2 years from ages 25 through 45. Every 3 years starting at age 30 through age 59 or 67 with a history of 3 consecutive normal Pap tests.  HPV screening.** / Every 3 years from ages 53 through ages 66 to 41 with a history of 3 consecutive normal Pap tests.  Hepatitis C blood test.** / For any individual with known risks for hepatitis C.  Skin self-exam. / Monthly.  Influenza vaccine. / Every year.  Tetanus, diphtheria, and acellular pertussis (Tdap, Td) vaccine.** / Consult your health care provider. Pregnant women should receive 1 dose of Tdap vaccine during each pregnancy. 1 dose of Td every 10 years.  Varicella vaccine.** / Consult your health care provider. Pregnant females who do not have evidence of immunity should receive the first dose after pregnancy.  HPV vaccine. / 3 doses over 6 months, if 59 and younger. The vaccine is not recommended for use in pregnant females. However, pregnancy testing  is not needed before receiving a dose.  Measles, mumps, rubella (MMR) vaccine.** / You need at least 1 dose of MMR if you were born in 1957 or later. You may also need a 2nd dose. For females of childbearing age, rubella immunity should be determined. If there is no evidence of immunity, females who are not pregnant should be  vaccinated. If there is no evidence of immunity, females who are pregnant should delay immunization until after pregnancy.  Pneumococcal 13-valent conjugate (PCV13) vaccine.** / Consult your health care provider.  Pneumococcal polysaccharide (PPSV23) vaccine.** / 1 to 2 doses if you smoke cigarettes or if you have certain conditions.  Meningococcal vaccine.** / 1 dose if you are age 65 to 93 years and a Market researcher living in a residence hall, or have one of several medical conditions, you need to get vaccinated against meningococcal disease. You may also need additional booster doses.  Hepatitis A vaccine.** / Consult your health care provider.  Hepatitis B vaccine.** / Consult your health care provider.  Haemophilus influenzae type b (Hib) vaccine.** / Consult your health care provider. Ages 31 to 64years  Blood pressure check.** / Every 1 to 2 years.  Lipid and cholesterol check.** / Every 5 years beginning at age 5 years.  Lung cancer screening. / Every year if you are aged 76 80 years and have a 30-pack-year history of smoking and currently smoke or have quit within the past 15 years. Yearly screening is stopped once you have quit smoking for at least 15 years or develop a health problem that would prevent you from having lung cancer treatment.  Clinical breast exam.** / Every year after age 62 years.  BRCA-related cancer risk assessment.** / For women who have family members with a BRCA-related cancer (breast, ovarian, tubal, or peritoneal cancers).  Mammogram.** / Every year beginning at age 53 years and continuing for as long as you are in good health. Consult with your health care provider.  Pap test.** / Every 3 years starting at age 37 years through age 39 or 57 years with a history of 3 consecutive normal Pap tests.  HPV screening.** / Every 3 years from ages 20 years through ages 10 to 29 years with a history of 3 consecutive normal Pap tests.  Fecal occult  blood test (FOBT) of stool. / Every year beginning at age 46 years and continuing until age 8 years. You may not need to do this test if you get a colonoscopy every 10 years.  Flexible sigmoidoscopy or colonoscopy.** / Every 5 years for a flexible sigmoidoscopy or every 10 years for a colonoscopy beginning at age 58 years and continuing until age 82 years.  Hepatitis C blood test.** / For all people born from [birth year] through 1965 and any individual with known risks for hepatitis C.  Skin self-exam. / Monthly.  Influenza vaccine. / Every year.  Tetanus, diphtheria, and acellular pertussis (Tdap/Td) vaccine.** / Consult your health care provider. Pregnant women should receive 1 dose of Tdap vaccine during each pregnancy. 1 dose of Td every 10 years.  Varicella vaccine.** / Consult your health care provider. Pregnant females who do not have evidence of immunity should receive the first dose after pregnancy.  Zoster vaccine.** / 1 dose for adults aged 86 years or older.  Measles, mumps, rubella (MMR) vaccine.** / You need at least 1 dose of MMR if you were born in 1957 or later. You may also need a 2nd dose. For females of  childbearing age, rubella immunity should be determined. If there is no evidence of immunity, females who are not pregnant should be vaccinated. If there is no evidence of immunity, females who are pregnant should delay immunization until after pregnancy.  Pneumococcal 13-valent conjugate (PCV13) vaccine.** / Consult your health care provider.  Pneumococcal polysaccharide (PPSV23) vaccine.** / 1 to 2 doses if you smoke cigarettes or if you have certain conditions.  Meningococcal vaccine.** / Consult your health care provider.  Hepatitis A vaccine.** / Consult your health care provider.  Hepatitis B vaccine.** / Consult your health care provider.  Haemophilus influenzae type b (Hib) vaccine.** / Consult your health care provider. Ages 108 years and over  Blood pressure  check.** / Every 1 to 2 years.  Lipid and cholesterol check.** / Every 5 years beginning at age 38 years.  Lung cancer screening. / Every year if you are aged 51 80 years and have a 30-pack-year history of smoking and currently smoke or have quit within the past 15 years. Yearly screening is stopped once you have quit smoking for at least 15 years or develop a health problem that would prevent you from having lung cancer treatment.  Clinical breast exam.** / Every year after age 31 years.  BRCA-related cancer risk assessment.** / For women who have family members with a BRCA-related cancer (breast, ovarian, tubal, or peritoneal cancers).  Mammogram.** / Every year beginning at age 32 years and continuing for as long as you are in good health. Consult with your health care provider.  Pap test.** / Every 3 years starting at age 33 years through age 28 or 78 years with 3 consecutive normal Pap tests. Testing can be stopped between 65 and 70 years with 3 consecutive normal Pap tests and no abnormal Pap or HPV tests in the past 10 years.  HPV screening.** / Every 3 years from ages 17 years through ages 54 or 59 years with a history of 3 consecutive normal Pap tests. Testing can be stopped between 65 and 70 years with 3 consecutive normal Pap tests and no abnormal Pap or HPV tests in the past 10 years.  Fecal occult blood test (FOBT) of stool. / Every year beginning at age 52 years and continuing until age 54 years. You may not need to do this test if you get a colonoscopy every 10 years.  Flexible sigmoidoscopy or colonoscopy.** / Every 5 years for a flexible sigmoidoscopy or every 10 years for a colonoscopy beginning at age 19 years and continuing until age 68 years.  Hepatitis C blood test.** / For all people born from [birth year] through 1965 and any individual with known risks for hepatitis C.  Osteoporosis screening.** / A one-time screening for women ages 37 years and over and women at risk for  fractures or osteoporosis.  Skin self-exam. / Monthly.  Influenza vaccine. / Every year.  Tetanus, diphtheria, and acellular pertussis (Tdap/Td) vaccine.** / 1 dose of Td every 10 years.  Varicella vaccine.** / Consult your health care provider.  Zoster vaccine.** / 1 dose for adults aged 60 years or older.  Pneumococcal 13-valent conjugate (PCV13) vaccine.** / Consult your health care provider.  Pneumococcal polysaccharide (PPSV23) vaccine.** / 1 dose for all adults aged 69 years and older.  Meningococcal vaccine.** / Consult your health care provider.  Hepatitis A vaccine.** / Consult your health care provider.  Hepatitis B vaccine.** / Consult your health care provider.  Haemophilus influenzae type b (Hib) vaccine.** / Consult your health care  provider. ** Family history and personal history of risk and conditions may change your health care provider's recommendations. Document Released: 01/14/2002 Document Revised: 09/08/2013 Document Reviewed: 04/15/2011 [facility] Patient Information 2014 [place], [place].

## 2014-03-03 ENCOUNTER — Telehealth: Payer: Self-pay | Admitting: *Deleted

## 2014-03-03 DIAGNOSIS — R7989 Other specified abnormal findings of blood chemistry: Secondary | ICD-10-CM

## 2014-03-03 LAB — URINALYSIS, ROUTINE W REFLEX MICROSCOPIC
BILIRUBIN URINE: NEGATIVE
GLUCOSE, UA: NEGATIVE mg/dL
HGB URINE DIPSTICK: NEGATIVE
Leukocytes, UA: NEGATIVE
Nitrite: NEGATIVE
PH: 6.5 (ref 5.0–8.0)
PROTEIN: NEGATIVE mg/dL
Specific Gravity, Urine: >1.03 — ABNORMAL HIGH (ref 1.005–1.030)
UROBILINOGEN UA: 1 mg/dL (ref 0.0–1.0)

## 2014-03-03 LAB — TSH: TSH: 0.335 u[IU]/mL — ABNORMAL LOW (ref 0.350–4.500)

## 2014-03-03 NOTE — Telephone Encounter (Signed)
Message copied by Rockwell Germany on Thu Mar 03, 2014 10:38 AM ------      Message from: Raiford Noble      Created: Thu Mar 03, 2014  7:14 AM       Labs look good overall.  TSH is slightly low which means her thyroid could be slightly overactive.  This could also be a lab error. Will recheck TSH and T4 levels in 4 weeks.  If still abnormal, then a further workup may be needed. ------

## 2014-03-03 NOTE — Telephone Encounter (Signed)
Patient informed, understood & agreed; new lab orders placed/SLS

## 2014-09-06 ENCOUNTER — Telehealth: Payer: Self-pay | Admitting: *Deleted

## 2014-09-06 NOTE — Telephone Encounter (Signed)
Pt did not pick up prescription for HYDROcodone-homatropine (HYCODAN) 5-1.5 MG/5ML syrup that was prescribed 01/26/2014.  Prescription shredded 09/06/2014.  bw

## 2015-01-22 LAB — HM PAP SMEAR: HM Pap smear: NORMAL

## 2015-10-03 ENCOUNTER — Ambulatory Visit (INDEPENDENT_AMBULATORY_CARE_PROVIDER_SITE_OTHER): Payer: BLUE CROSS/BLUE SHIELD | Admitting: Physician Assistant

## 2015-10-03 ENCOUNTER — Encounter: Payer: Self-pay | Admitting: Physician Assistant

## 2015-10-03 VITALS — BP 112/77 | HR 84 | Temp 98.5°F | Resp 16 | Ht 65.0 in | Wt 161.5 lb

## 2015-10-03 DIAGNOSIS — J019 Acute sinusitis, unspecified: Secondary | ICD-10-CM | POA: Diagnosis not present

## 2015-10-03 DIAGNOSIS — B9689 Other specified bacterial agents as the cause of diseases classified elsewhere: Secondary | ICD-10-CM

## 2015-10-03 MED ORDER — AMOXICILLIN-POT CLAVULANATE 875-125 MG PO TABS: 1.0000 | ORAL_TABLET | Freq: Two times a day (BID) | ORAL | Status: DC

## 2015-10-03 NOTE — Patient Instructions (Signed)
Please take antibiotic as directed.  Increase fluid intake.  Use Saline nasal spray.  Take a daily multivitamin. Use Tylenol Sinus as directed.  Place a humidifier in the bedroom.  Please call or return clinic if symptoms are not improving.  Sinusitis Sinusitis is redness, soreness, and swelling (inflammation) of the paranasal sinuses. Paranasal sinuses are air pockets within the bones of your face (beneath the eyes, the middle of the forehead, or above the eyes). In healthy paranasal sinuses, mucus is able to drain out, and air is able to circulate through them by way of your nose. However, when your paranasal sinuses are inflamed, mucus and air can become trapped. This can allow bacteria and other germs to grow and cause infection. Sinusitis can develop quickly and last only a short time (acute) or continue over a long period (chronic). Sinusitis that lasts for more than 12 weeks is considered chronic.  CAUSES  Causes of sinusitis include:  Allergies.  Structural abnormalities, such as displacement of the cartilage that separates your nostrils (deviated septum), which can decrease the air flow through your nose and sinuses and affect sinus drainage.  Functional abnormalities, such as when the small hairs (cilia) that line your sinuses and help remove mucus do not work properly or are not present. SYMPTOMS  Symptoms of acute and chronic sinusitis are the same. The primary symptoms are pain and pressure around the affected sinuses. Other symptoms include:  Upper toothache.  Earache.  Headache.  Bad breath.  Decreased sense of smell and taste.  A cough, which worsens when you are lying flat.  Fatigue.  Fever.  Thick drainage from your nose, which often is green and may contain pus (purulent).  Swelling and warmth over the affected sinuses. DIAGNOSIS  Your caregiver will perform a physical exam. During the exam, your caregiver may:  Look in your nose for signs of abnormal growths  in your nostrils (nasal polyps).  Tap over the affected sinus to check for signs of infection.  View the inside of your sinuses (endoscopy) with a special imaging device with a light attached (endoscope), which is inserted into your sinuses. If your caregiver suspects that you have chronic sinusitis, one or more of the following tests may be recommended:  Allergy tests.  Nasal culture A sample of mucus is taken from your nose and sent to a lab and screened for bacteria.  Nasal cytology A sample of mucus is taken from your nose and examined by your caregiver to determine if your sinusitis is related to an allergy. TREATMENT  Most cases of acute sinusitis are related to a viral infection and will resolve on their own within 10 days. Sometimes medicines are prescribed to help relieve symptoms (pain medicine, decongestants, nasal steroid sprays, or saline sprays).  However, for sinusitis related to a bacterial infection, your caregiver will prescribe antibiotic medicines. These are medicines that will help kill the bacteria causing the infection.  Rarely, sinusitis is caused by a fungal infection. In theses cases, your caregiver will prescribe antifungal medicine. For some cases of chronic sinusitis, surgery is needed. Generally, these are cases in which sinusitis recurs more than 3 times per year, despite other treatments. HOME CARE INSTRUCTIONS   Drink plenty of water. Water helps thin the mucus so your sinuses can drain more easily.  Use a humidifier.  Inhale steam 3 to 4 times a day (for example, sit in the bathroom with the shower running).  Apply a warm, moist washcloth to your face 3  to 4 times a day, or as directed by your caregiver.  Use saline nasal sprays to help moisten and clean your sinuses.  Take over-the-counter or prescription medicines for pain, discomfort, or fever only as directed by your caregiver. SEEK IMMEDIATE MEDICAL CARE IF:  You have increasing pain or severe  headaches.  You have nausea, vomiting, or drowsiness.  You have swelling around your face.  You have vision problems.  You have a stiff neck.  You have difficulty breathing. MAKE SURE YOU:   Understand these instructions.  Will watch your condition.  Will get help right away if you are not doing well or get worse. Document Released: 11/18/2005 Document Revised: 02/10/2012 Document Reviewed: 12/03/2011 [facility] Patient Information 2014 [place], [place].

## 2015-10-03 NOTE — Assessment & Plan Note (Signed)
Rx Augmentin.  Increase fluids.  Rest.  Saline nasal spray.  Probiotic.  Mucinex as directed.  Humidifier in bedroom.  Call or return to clinic if symptoms are not improving.

## 2015-10-03 NOTE — Progress Notes (Signed)
Pre visit review using our clinic review tool, if applicable. No additional management support is needed unless otherwise documented below in the visit note/SLS  

## 2015-10-03 NOTE — Progress Notes (Signed)
   Patient presents to clinic today c/o 4-5 days of progressively worsening sinus pressure, pain, sinus headache with bilateral ear pressure/pain. Denies tooth pain. Denies fever, chest congestion but endorses mild dry cough with PND. Denies recent travel or sick contact.   No past medical history on file.  Current Outpatient Prescriptions on File Prior to Visit  Medication Sig Dispense Refill  . Biotin 1 MG CAPS Take 2 each by mouth daily.    . NON FORMULARY Emergen-C Vitamin C liquid     No current facility-administered medications on file prior to visit.    No Known Allergies  Family History  Problem Relation Age of Onset  . Hypertension Mother     Living  . Hypertension Father     Living  . Alzheimer's disease Paternal Grandmother   . Lung cancer Maternal Grandmother   . Lung cancer Maternal Grandfather   . Hypertension Brother   . Hyperlipidemia Brother   . Allergies Daughter     Environmental  . Asthma Daughter     Social History   Social History  . Marital Status: Married    Spouse Name: N/A  . Number of Children: N/A  . Years of Education: N/A   Social History Main Topics  . Smoking status: Never Smoker   . Smokeless tobacco: Never Used  . Alcohol Use: No  . Drug Use: No  . Sexual Activity: Yes    Birth Control/ Protection: None     Comment: married -   Other Topics Concern  . None   Social History Narrative   Review of Systems - See HPI.  All other ROS are negative.  BP 112/77 mmHg  Pulse 84  Temp(Src) 98.5 F (36.9 C) (Oral)  Resp 16  Ht 5' 5"  (1.651 m)  Wt 161 lb 8 oz (73.256 kg)  BMI 26.88 kg/m2  SpO2 100%  LMP 09/28/2015  Physical Exam  Constitutional: She is oriented to person, place, and time and well-developed, well-nourished, and in no distress.  HENT:  Head: Normocephalic and atraumatic.  Right Ear: External ear normal.  Left Ear: External ear normal.  Nose: Nose normal.  Mouth/Throat: Oropharynx is clear and moist. No  oropharyngeal exudate.  + TTP of sinuses as directed.  Eyes: Conjunctivae are normal.  Neck: Neck supple.  Cardiovascular: Normal rate, regular rhythm, normal heart sounds and intact distal pulses.   Pulmonary/Chest: Effort normal and breath sounds normal. No respiratory distress. She has no wheezes. She has no rales. She exhibits no tenderness.  Neurological: She is alert and oriented to person, place, and time.  Skin: Skin is warm and dry. No rash noted.  Psychiatric: Affect normal.  Vitals reviewed.   No results found for this or any previous visit (from the past 2160 hour(s)).  Assessment/Plan: Acute bacterial sinusitis Rx Augmentin.  Increase fluids.  Rest.  Saline nasal spray.  Probiotic.  Mucinex as directed.  Humidifier in bedroom.  Call or return to clinic if symptoms are not improving.

## 2015-11-07 ENCOUNTER — Telehealth: Payer: Self-pay | Admitting: Physician Assistant

## 2015-11-07 NOTE — Telephone Encounter (Signed)
Left message on voicemail 12-06 for patient to call and schedule Flu Shot

## 2016-01-03 ENCOUNTER — Telehealth: Payer: Self-pay | Admitting: Physician Assistant

## 2016-01-03 NOTE — Telephone Encounter (Signed)
LM for pt to call and schedule flu shot or update records.

## 2016-01-23 ENCOUNTER — Encounter: Payer: Self-pay | Admitting: Physician Assistant

## 2016-01-23 ENCOUNTER — Ambulatory Visit (INDEPENDENT_AMBULATORY_CARE_PROVIDER_SITE_OTHER): Payer: BLUE CROSS/BLUE SHIELD | Admitting: Physician Assistant

## 2016-01-23 VITALS — BP 138/89 | HR 94 | Temp 98.9°F | Ht 65.0 in | Wt 163.0 lb

## 2016-01-23 DIAGNOSIS — J069 Acute upper respiratory infection, unspecified: Secondary | ICD-10-CM | POA: Insufficient documentation

## 2016-01-23 LAB — POCT INFLUENZA A/B
INFLUENZA B, POC: NEGATIVE
Influenza A, POC: NEGATIVE

## 2016-01-23 NOTE — Patient Instructions (Addendum)
Your flu swab is negative. I am glad your symptoms are improving. It is likely you just have a mild viral illness that your body is taking care of.  Increase fluids, rest. Use OTC medications as needed for symptom relief.  Follow-up if symptoms are not continuing to improve.  Upper Respiratory Infection, Adult Most upper respiratory infections (URIs) are caused by a virus. A URI affects the nose, throat, and upper air passages. The most common type of URI is often called "the common cold." HOME CARE   Take medicines only as told by your doctor.  Gargle warm saltwater or take cough drops to comfort your throat as told by your doctor.  Use a warm mist humidifier or inhale steam from a shower to increase air moisture. This may make it easier to breathe.  Drink enough fluid to keep your pee (urine) clear or pale yellow.  Eat soups and other clear broths.  Have a healthy diet.  Rest as needed.  Go back to work when your fever is gone or your doctor says it is okay.  You may need to stay home longer to avoid giving your URI to others.  You can also wear a face mask and wash your hands often to prevent spread of the virus.  Use your inhaler more if you have asthma.  Do not use any tobacco products, including cigarettes, chewing tobacco, or electronic cigarettes. If you need help quitting, ask your doctor. GET HELP IF:  You are getting worse, not better.  Your symptoms are not helped by medicine.  You have chills.  You are getting more short of breath.  You have brown or red mucus.  You have yellow or brown discharge from your nose.  You have pain in your face, especially when you bend forward.  You have a fever.  You have puffy (swollen) neck glands.  You have pain while swallowing.  You have white areas in the back of your throat. GET HELP RIGHT AWAY IF:   You have very bad or constant:  Headache.  Ear pain.  Pain in your forehead, behind your eyes, and over  your cheekbones (sinus pain).  Chest pain.  You have long-lasting (chronic) lung disease and any of the following:  Wheezing.  Long-lasting cough.  Coughing up blood.  A change in your usual mucus.  You have a stiff neck.  You have changes in your:  Vision.  Hearing.  Thinking.  Mood. MAKE SURE YOU:   Understand these instructions.  Will watch your condition.  Will get help right away if you are not doing well or get worse.   This information is not intended to replace advice given to you by your health care provider. Make sure you discuss any questions you have with your health care provider.   Document Released: 05/06/2008 Document Revised: 04/04/2015 Document Reviewed: 02/23/2014 Elsevier Interactive Patient Education [employer].

## 2016-01-23 NOTE — Addendum Note (Signed)
Addended by: Harl Bowie on: 01/23/2016 05:05 PM   Modules accepted: Orders

## 2016-01-23 NOTE — Progress Notes (Signed)
Pre visit review using our clinic review tool, if applicable. No additional management support is needed unless otherwise documented below in the visit note. 

## 2016-01-23 NOTE — Progress Notes (Signed)
   Patient presents to clinic today c/o chills, headache, sore throat starting last night. Endorses some aches. Denies fever yesterday. States her daughter was diagnosed with the flu on Friday. Is being treated with Tamiflu. Patient endorses feeling better today -- no cough, sore throat, chills or aches.   No past medical history on file.  Current Outpatient Prescriptions on File Prior to Visit  Medication Sig Dispense Refill  . Biotin 1 MG CAPS Take 2 each by mouth daily.    . NON FORMULARY Reported on 01/23/2016     No current facility-administered medications on file prior to visit.    No Known Allergies  Family History  Problem Relation Age of Onset  . Hypertension Mother     Living  . Hypertension Father     Living  . Alzheimer's disease Paternal Grandmother   . Lung cancer Maternal Grandmother   . Lung cancer Maternal Grandfather   . Hypertension Brother   . Hyperlipidemia Brother   . Allergies Daughter     Environmental  . Asthma Daughter     Social History   Social History  . Marital Status: Married    Spouse Name: N/A  . Number of Children: N/A  . Years of Education: N/A   Social History Main Topics  . Smoking status: Never Smoker   . Smokeless tobacco: Never Used  . Alcohol Use: No  . Drug Use: No  . Sexual Activity: Yes    Birth Control/ Protection: None     Comment: married -   Other Topics Concern  . None   Social History Narrative   Review of Systems - See HPI.  All other ROS are negative.  BP 138/89 mmHg  Pulse 94  Temp(Src) 98.9 F (37.2 C) (Oral)  Ht 5' 5"  (1.651 m)  Wt 163 lb (73.936 kg)  BMI 27.12 kg/m2  SpO2 100%  LMP 01/07/2016  Physical Exam  Constitutional: She is oriented to person, place, and time and well-developed, well-nourished, and in no distress.  HENT:  Head: Normocephalic and atraumatic.  Right Ear: External ear normal.  Left Ear: External ear normal.  Nose: Nose normal.  Mouth/Throat: Oropharynx is clear and  moist.  TM within normal limits  Eyes: Conjunctivae are normal.  Neck: Neck supple.  Cardiovascular: Normal rate, regular rhythm, normal heart sounds and intact distal pulses.   Pulmonary/Chest: Effort normal and breath sounds normal. No respiratory distress. She has no wheezes. She has no rales. She exhibits no tenderness.  Neurological: She is alert and oriented to person, place, and time.  Psychiatric: Affect normal.  Vitals reviewed.  Assessment/Plan: Viral URI One day of symptoms, already feeling a lot better than last night. Afebrile. Flu negative. Supportive measures reviewed. Follow-up if anything worsens.

## 2016-01-23 NOTE — Assessment & Plan Note (Signed)
One day of symptoms, already feeling a lot better than last night. Afebrile. Flu negative. Supportive measures reviewed. Follow-up if anything worsens.

## 2016-01-26 ENCOUNTER — Telehealth: Payer: Self-pay | Admitting: Physician Assistant

## 2016-01-26 MED ORDER — AMOXICILLIN-POT CLAVULANATE 875-125 MG PO TABS: 1.0000 | ORAL_TABLET | Freq: Two times a day (BID) | ORAL | Status: DC

## 2016-01-26 NOTE — Telephone Encounter (Signed)
Pt is returning your call. Pt says that she is at work so she would like to have a detailed message when call back to advise her further.    Thanks.

## 2016-01-26 NOTE — Telephone Encounter (Signed)
Please call patient. Ok to leave detailed message per prior notes. I am sending in an Rx for Augmentin to to her [facility] in the [facility] on [address]. Take as directed with food. Increase fluids, rest and continue care discussed at last visit.

## 2016-01-26 NOTE — Telephone Encounter (Signed)
Pt was seen on 2/21 and is not feeling any better. Pt would like to know if PCP could call something in to pharmacy for her. Pt says that she has been taking otc meds as suggested by PCP.   CB: [phone]

## 2016-01-26 NOTE — Telephone Encounter (Signed)
Called and [facility] @ 11:57am  @ [phone]) asking the pt to RTC regarding note below.//AB/CMA

## 2016-01-26 NOTE — Telephone Encounter (Signed)
Per per-Called and LMOM @ 2:10pm @ [phone]) informing the pt of the note and recommendation from [place] below.  Informed pt if she has any questions to please give [place] a call back.//AB/CMA

## 2016-01-26 NOTE — Telephone Encounter (Signed)
Pt calling you again. She is requesting call again. She states she is feeling really bad today. She is at work and can't always answer her phone. Sinus pressure, sneezing, drainage, headache, eyes hurting, yellow mucus. She states she needs an answer left on her machine if she can get something called in.  [facility], [place]

## 2016-01-26 NOTE — Telephone Encounter (Signed)
Assess current symptoms. At visit she noted she was feeling better than the day before

## 2016-05-07 DIAGNOSIS — Z01419 Encounter for gynecological examination (general) (routine) without abnormal findings: Secondary | ICD-10-CM | POA: Diagnosis not present

## 2016-05-07 DIAGNOSIS — Z1151 Encounter for screening for human papillomavirus (HPV): Secondary | ICD-10-CM | POA: Diagnosis not present

## 2016-05-23 DIAGNOSIS — Z13220 Encounter for screening for lipoid disorders: Secondary | ICD-10-CM | POA: Diagnosis not present

## 2016-05-23 DIAGNOSIS — Z1231 Encounter for screening mammogram for malignant neoplasm of breast: Secondary | ICD-10-CM | POA: Diagnosis not present

## 2016-10-12 ENCOUNTER — Emergency Department (HOSPITAL_BASED_OUTPATIENT_CLINIC_OR_DEPARTMENT_OTHER)
Admission: EM | Admit: 2016-10-12 | Discharge: 2016-10-13 | Disposition: A | Discharge status: Home / Self Care | Payer: BLUE CROSS/BLUE SHIELD | Attending: Emergency Medicine | Admitting: Emergency Medicine

## 2016-10-12 ENCOUNTER — Encounter (HOSPITAL_BASED_OUTPATIENT_CLINIC_OR_DEPARTMENT_OTHER): Payer: Self-pay | Admitting: *Deleted

## 2016-10-12 DIAGNOSIS — J3489 Other specified disorders of nose and nasal sinuses: Secondary | ICD-10-CM | POA: Diagnosis not present

## 2016-10-12 DIAGNOSIS — H9202 Otalgia, left ear: Secondary | ICD-10-CM | POA: Diagnosis present

## 2016-10-12 DIAGNOSIS — H6122 Impacted cerumen, left ear: Secondary | ICD-10-CM

## 2016-10-12 DIAGNOSIS — R0981 Nasal congestion: Secondary | ICD-10-CM

## 2016-10-12 MED ORDER — DOCUSATE SODIUM 100 MG PO CAPS
100.0000 mg | ORAL_CAPSULE | Freq: Once | ORAL | Status: AC
  Administered 2016-10-12: 100 mg via ORAL
  Filled 2016-10-12: qty 1

## 2016-10-12 NOTE — ED Provider Notes (Addendum)
[facility] Provider Note   CSN: [id number] Arrival date & time: 10/12/16  2121  By signing my name below, I, Ephriam Jenkins, attest that this documentation has been prepared under the direction and in the presence of Shary Decamp PA-C.  Electronically Signed: Ephriam Jenkins, ED Scribe. 10/12/16. 11:18 PM.   History   Chief Complaint Chief Complaint  Patient presents with  . Otalgia    HPI HPI Comments: Paige Wheeler is a 40 y.o. female who presents to the Emergency Department complaining of constant left ear pain and loss of hearing with associated rhinorrhea and sinus pressure that started three days ago. She also reports a sore throat that started earlier this week but has improved. She has Hx of similar ear pain and loss of hearing due to excessive ear wax. Pt has not seen an ENT doctor for her symptoms. No fever, chest pain or shortness of breath. No other symptoms noted.   The history is provided by the patient. No language interpreter was used.    History reviewed. No pertinent past medical history.  Patient Active Problem List   Diagnosis Date Noted  . Viral URI 01/23/2016  . Visit for preventive health examination 03/02/2014  . Encounter to establish care 01/28/2014  . ALLERGIC RHINITIS 01/08/2010    Past Surgical History:  Procedure Laterality Date  . Unremarkable.      OB History    No data available       Home Medications    Prior to Admission medications   Medication Sig Start Date End Date Taking? Authorizing Provider  Biotin 1 MG CAPS Take 2 each by mouth daily.    Historical Provider, MD  NON FORMULARY Reported on 01/23/2016    Historical Provider, MD    Family History Family History  Problem Relation Age of Onset  . Hypertension Mother     Living  . Hypertension Father     Living  . Alzheimer's disease Paternal Grandmother   . Lung cancer Maternal Grandmother   . Lung cancer Maternal Grandfather   . Hypertension Brother   .  Hyperlipidemia Brother   . Allergies Daughter     Environmental  . Asthma Daughter     Social History Social History  Substance Use Topics  . Smoking status: Never Smoker  . Smokeless tobacco: Never Used  . Alcohol use No    Allergies   Patient has no known allergies.   Review of Systems Review of Systems  Constitutional: Negative for fever.  HENT: Positive for congestion, ear pain (left), hearing loss (left ear), rhinorrhea and sinus pressure.   Respiratory: Negative for shortness of breath.   Cardiovascular: Negative for chest pain.  All other systems reviewed and are negative.  Physical Exam Updated Vital Signs BP 126/89 (BP Location: Left Arm)   Pulse 90   Temp 98 F (36.7 C) (Oral)   Resp 20   Ht 5' 5"  (1.651 m)   Wt 160 lb (72.6 kg)   LMP 09/29/2016   SpO2 100%   BMI 26.63 kg/m   Physical Exam  Constitutional: She is oriented to person, place, and time. Vital signs are normal. She appears well-developed and well-nourished. No distress.  HENT:  Head: Normocephalic and atraumatic.  Right Ear: Hearing, tympanic membrane, external ear and ear canal normal.  Left Ear: Hearing normal.  Right ear with minimal cerumen. Left ear with cerumen impaction. No erythema. No mastoid tenderness.  Eyes: Conjunctivae and EOM are normal. Pupils are equal,  round, and reactive to light.  Neck: Normal range of motion. Neck supple.  Cardiovascular: Normal rate and regular rhythm.   Pulmonary/Chest: Effort normal.  Neurological: She is alert and oriented to person, place, and time.  Skin: Skin is warm and dry. She is not diaphoretic.  Psychiatric: She has a normal mood and affect. Her speech is normal and behavior is normal. Judgment and thought content normal.  Nursing note and vitals reviewed.  ED Treatments / Results  DIAGNOSTIC STUDIES: Oxygen Saturation is 100% on RA, normal by my interpretation.  COORDINATION OF CARE: 11:13 PM-Will order cerumen impaction removal.  Discussed treatment plan with pt at bedside and pt agreed to plan.   Labs (all labs ordered are listed, but only abnormal results are displayed) Labs Reviewed - No data to display  EKG  EKG Interpretation None      Radiology No results found.  Procedures .Ear Cerumen Removal Date/Time: 10/12/2016 11:15 PM Performed by: Shary Decamp Authorized by: Shary Decamp   Consent:    Consent obtained:  Verbal   Consent given by:  Patient   Risks discussed:  Bleeding and infection Procedure details:    Location:  L ear   Procedure type: curette   Post-procedure details:    Inspection:  TM intact and macerated skin   Hearing quality:  Normal   Patient tolerance of procedure:  Tolerated well, no immediate complications    (including critical care time)  Medications Ordered in ED Medications - No data to display  Initial Impression / Assessment and Plan / ED Course  I have reviewed the triage vital signs and the nursing notes.  Pertinent labs & imaging results that were available during my care of the patient were reviewed by me and considered in my medical decision making (see chart for details).  Clinical Course    Final Clinical Impressions(s) / ED Diagnoses  I have reviewed the relevant previous healthcare records. I obtained HPI from historian.  ED Course:  Assessment: Pt is a 40yM who presents with left ear pain x 2-3 days. No fevers. On exam, pt in NAD. Nontoxic/nonseptic appearing. VSS. Afebrile. Left ear with cerumen impaction noted. No surrounding erythema. Irrigated in ED with TM visualized. No signs of infection. No mastoid tenderness. Given Rx Flonase for congestion. Likely viral. Plan is to DC home with follow up to PCP/ Given referral to ENT due to frequent cerumen impactions. At time of discharge, Patient is in no acute distress. Vital Signs are stable. Patient is able to ambulate. Patient able to tolerate PO.   Disposition/Plan:  DC Home Additional Verbal  discharge instructions given and discussed with patient.  Pt Instructed to f/u with PCP in the next week for evaluation and treatment of symptoms. Return precautions given Pt acknowledges and agrees with plan  Supervising Physician Varney Biles, MD   Final diagnoses:  Sinus congestion  Impacted cerumen of left ear    New Prescriptions New Prescriptions   No medications on file   I personally performed the services described in this documentation, which was scribed in my presence. The recorded information has been reviewed and is accurate.     Shary Decamp, PA-C 10/13/16 [id number]    Varney Biles, MD 10/13/16 2326    Shary Decamp, PA-C 11/03/16 [place], MD 11/03/16 [phone]

## 2016-10-12 NOTE — ED Notes (Signed)
Cold symptoms this week and left ear pain and c/o of hearing impaired to left ear.

## 2016-10-12 NOTE — ED Triage Notes (Signed)
Left ear wax build up.  Also reports a lot of sinus pressure.

## 2016-10-12 NOTE — ED Notes (Signed)
ED Provider at bedside. 

## 2016-10-13 MED ORDER — FLUTICASONE PROPIONATE 50 MCG/ACT NA SUSP: 1.0000 | Freq: Every day | NASAL | 0 refills | Status: AC

## 2016-10-13 NOTE — Discharge Instructions (Signed)
Please read and follow all provided instructions.  Your diagnoses today include:  1. Sinus congestion   2. Impacted cerumen of left ear    Tests performed today include: Vital signs. See below for your results today.   Medications prescribed:  Take as prescribed   Home care instructions:  Follow any educational materials contained in this packet.  Follow-up instructions: Please follow-up with your primary care provider for further evaluation of symptoms and treatment   Return instructions:  Please return to the Emergency Department if you do not get better, if you get worse, or new symptoms OR  - Fever (temperature greater than 101.11F)  - Bleeding that does not stop with holding pressure to the area    -Severe pain (please note that you may be more sore the day after your accident)  - Chest Pain  - Difficulty breathing  - Severe nausea or vomiting  - Inability to tolerate food and liquids  - Passing out  - Skin becoming red around your wounds  - Change in mental status (confusion or lethargy)  - New numbness or weakness    Please return if you have any other emergent concerns.  Additional Information:  Your vital signs today were: BP 126/89 (BP Location: Left Arm)    Pulse 90    Temp 98 F (36.7 C) (Oral)    Resp 20    Ht 5' 5"  (1.651 m)    Wt 72.6 kg    LMP 09/29/2016    SpO2 100%    BMI 26.63 kg/m  If your blood pressure (BP) was elevated above 135/85 this visit, please have this repeated by your doctor within one month. ---------------

## 2017-07-30 ENCOUNTER — Ambulatory Visit (INDEPENDENT_AMBULATORY_CARE_PROVIDER_SITE_OTHER): Payer: BLUE CROSS/BLUE SHIELD | Admitting: Family Medicine

## 2017-07-30 ENCOUNTER — Encounter: Payer: Self-pay | Admitting: Family Medicine

## 2017-07-30 VITALS — BP 130/84 | HR 85 | Temp 98.2°F | Ht 65.0 in | Wt 162.4 lb

## 2017-07-30 DIAGNOSIS — H6501 Acute serous otitis media, right ear: Secondary | ICD-10-CM | POA: Diagnosis not present

## 2017-07-30 MED ORDER — AMOXICILLIN 500 MG PO CAPS: 500.0000 mg | ORAL_CAPSULE | Freq: Two times a day (BID) | ORAL | 0 refills | Status: AC

## 2017-07-30 NOTE — Patient Instructions (Signed)
If you start having drainage/bleeding from ear or fevers, seek immediate care. Urgent care is fine if over the weekend.   Let us know if you need anything.

## 2017-07-30 NOTE — Progress Notes (Signed)
Chief Complaint  Patient presents with  . Ear Pain    Pt is here for right ear pain. Pain feels like it is outside. Duration: 1 days Progression: unchanged Associated symptoms: +wax Denies: sore throat, congestion, coryza, sneezing, ear pressure and sinus pressure, bleeding, feversor discharge from ear Treatment to date: Was using colace drops in ear  ROS:  HEENT: +ear pain Costitutional: Denies fevers  Past Medical History:  Diagnosis Date  . No known health problems    Family History  Problem Relation Age of Onset  . Hypertension Mother        Living  . Hypertension Father        Living  . Alzheimer's disease Paternal Grandmother   . Lung cancer Maternal Grandmother   . Lung cancer Maternal Grandfather   . Hypertension Brother   . Hyperlipidemia Brother   . Allergies Daughter        Environmental  . Asthma Daughter    Past Surgical History:  Procedure Laterality Date  . Unremarkable.      BP 130/84 (BP Location: Left Arm, Patient Position: Sitting, Cuff Size: Normal)   Pulse 85   Temp 98.2 F (36.8 C) (Oral)   Ht 5' 5"  (1.651 m)   Wt 162 lb 6 oz (73.7 kg)   SpO2 98%   BMI 27.02 kg/m  General: Awake, alert, appearing stated age HEENT:  L ear- canal patent without drainage or erythema, TM is neg R ear- Initially canal 100% obstructed with cerumen; After lavage and instrumental removal, canal patent without drainage or erythema, TM is erythematous, no purulent material, mild bulging; +TTP over outer ear, no mastoid TTP, no TMJ TTP Nose- nares patent and without discharge Mouth- Lips, gums and dentition unremarkable, pharynx is without erythema or exudate Neck: No adenopathy Lungs: Normal effort, no accessory muscle use Psych: Age appropriate judgment and insight, normal mood and affect  Cerumen removal, R Verbal consent obtained. Robin Ewing, CMA, provided irrigation with some success of removal.  There was still cerumen impeding my view of TM, I used a  curette to remove a large piece of wax and resolve the blockage. She tolerated this well. There were no complications noted.  Right acute serous otitis media, recurrence not specified - Plan: amoxicillin (AMOXIL) 500 MG capsule  Orders as above. Send MyChart message on Monday if no better, will try Otic drops. F/u prn. Pt voiced understanding and agreement to the plan.  [place], DO 07/30/17 8:52 AM

## 2017-07-30 NOTE — Progress Notes (Signed)
Pre visit review using our clinic review tool, if applicable. No additional management support is needed unless otherwise documented below in the visit note. 

## 2017-08-27 DIAGNOSIS — L219 Seborrheic dermatitis, unspecified: Secondary | ICD-10-CM | POA: Diagnosis not present

## 2017-09-05 DIAGNOSIS — Z6827 Body mass index (BMI) 27.0-27.9, adult: Secondary | ICD-10-CM | POA: Diagnosis not present

## 2017-09-05 DIAGNOSIS — Z1231 Encounter for screening mammogram for malignant neoplasm of breast: Secondary | ICD-10-CM | POA: Diagnosis not present

## 2017-09-05 DIAGNOSIS — Z01419 Encounter for gynecological examination (general) (routine) without abnormal findings: Secondary | ICD-10-CM | POA: Diagnosis not present

## 2018-06-22 DIAGNOSIS — R51 Headache: Secondary | ICD-10-CM | POA: Diagnosis not present

## 2018-06-22 DIAGNOSIS — S46912A Strain of unspecified muscle, fascia and tendon at shoulder and upper arm level, left arm, initial encounter: Secondary | ICD-10-CM | POA: Diagnosis not present

## 2018-06-22 DIAGNOSIS — M542 Cervicalgia: Secondary | ICD-10-CM | POA: Diagnosis not present

## 2018-10-06 DIAGNOSIS — Z01419 Encounter for gynecological examination (general) (routine) without abnormal findings: Secondary | ICD-10-CM | POA: Diagnosis not present

## 2018-10-06 DIAGNOSIS — Z1231 Encounter for screening mammogram for malignant neoplasm of breast: Secondary | ICD-10-CM | POA: Diagnosis not present

## 2018-10-06 DIAGNOSIS — Z6827 Body mass index (BMI) 27.0-27.9, adult: Secondary | ICD-10-CM | POA: Diagnosis not present

## 2018-10-09 ENCOUNTER — Other Ambulatory Visit: Payer: Self-pay | Admitting: Obstetrics and Gynecology

## 2018-10-09 DIAGNOSIS — N6320 Unspecified lump in the left breast, unspecified quadrant: Secondary | ICD-10-CM

## 2018-10-13 ENCOUNTER — Ambulatory Visit
Admission: RE | Admit: 2018-10-13 | Discharge: 2018-10-13 | Disposition: A | Discharge status: Home / Self Care | Payer: BLUE CROSS/BLUE SHIELD | Source: Ambulatory Visit | Attending: Obstetrics and Gynecology | Admitting: Obstetrics and Gynecology

## 2018-10-13 DIAGNOSIS — N6002 Solitary cyst of left breast: Secondary | ICD-10-CM | POA: Diagnosis not present

## 2018-10-13 DIAGNOSIS — N6320 Unspecified lump in the left breast, unspecified quadrant: Secondary | ICD-10-CM

## 2018-10-13 DIAGNOSIS — R922 Inconclusive mammogram: Secondary | ICD-10-CM | POA: Diagnosis not present

## 2019-09-16 ENCOUNTER — Ambulatory Visit (INDEPENDENT_AMBULATORY_CARE_PROVIDER_SITE_OTHER): Payer: BC Managed Care – PPO | Admitting: Orthopaedic Surgery

## 2019-09-16 ENCOUNTER — Ambulatory Visit: Payer: Self-pay

## 2019-09-16 ENCOUNTER — Encounter: Payer: Self-pay | Admitting: Orthopaedic Surgery

## 2019-09-16 ENCOUNTER — Other Ambulatory Visit: Payer: Self-pay

## 2019-09-16 DIAGNOSIS — M79641 Pain in right hand: Secondary | ICD-10-CM

## 2019-09-16 DIAGNOSIS — M79601 Pain in right arm: Secondary | ICD-10-CM

## 2019-09-16 DIAGNOSIS — M542 Cervicalgia: Secondary | ICD-10-CM | POA: Diagnosis not present

## 2019-09-16 MED ORDER — METHYLPREDNISOLONE 4 MG PO TABS: ORAL_TABLET | ORAL | 0 refills | Status: AC

## 2019-09-16 NOTE — Progress Notes (Signed)
Office Visit Note   Patient: Paige Wheeler           Date of Birth: [birth year]           MRN: [id number] Visit Date: 09/16/2019              Requested by: No referring provider defined for this encounter. PCP: No primary care provider on file.   Assessment & Plan: Visit Diagnoses:  1. Right arm pain     Plan: Based on her clinical exam findings I would like to obtain nerve conduction studies of the right upper extremity to assess for nerve compression at the transverse carpal ligament.  We will try Velcro wrist splint and a 6-day steroid taper since her symptoms are worsening.  She will wear the splint mainly at night we can wear during the daytime.  All question concerns were answered addressed.  I would like to see her back after the nerve conduction studies.  Follow-Up Instructions: Return in about 3 weeks (around 10/07/2019).   Orders:  Orders Placed This Encounter  Procedures  . XR Cervical Spine 2 or 3 views   Meds ordered this encounter  Medications  . methylPREDNISolone (MEDROL) 4 MG tablet    Sig: Medrol dose pack. Take as instructed    Dispense:  21 tablet    Refill:  0      Procedures: No procedures performed   Clinical Data: No additional findings.   Subjective: Chief Complaint  Patient presents with  . Right Wrist - Pain  . Right Arm - Pain  The patient is a very pleasant 43 year old right-hand-dominant female has been dealing with numbness and tingling in her right hand for about 3 to 4 months now that is becoming more uncomfortable.  She has a lot of work on the computer as well.  She denies any neck pain but does report pain going from her shoulder and mainly into the forearm as well as into the right hand.  Her mother does have a history of carpal tunnel syndrome and had surgery for this before.  She denies any radicular symptoms of her left upper extremity and again denies any trauma or neck pain.  HPI  Review of Systems She currently denies  any headache, chest pain, shortness of breath, fever, chills, nausea, vomiting  Objective: Vital Signs: There were no vitals taken for this visit.  Physical Exam She is alert and orient x3 and in no acute distress Ortho Exam Examination of her cervical spine shows that she has full range of motion cervical spine with no pain to palpation along the posterior neck or the lateral aspects of the neck.  Her Spurling's exam and side is negative.  She has good strength in her bilateral upper extremities except for her grip strength on her right dominant side is less than the left side.  She does have some numbness mainly in the median nerve distribution on her left side.  Her Phalen's and Tinel's exam showed more numbness on the Phalen's aspect of the exam then a true Tinel sign. Specialty Comments:  No specialty comments available.  Imaging: Xr Cervical Spine 2 Or 3 Views  Result Date: 09/16/2019 2 views of the cervical spine show a loss of the cervical lordosis on lateral view.  There is also arthritic changes with degenerative disc disease at the mid cervical spine.    PMFS History: Patient Active Problem List   Diagnosis Date Noted  . Viral URI 01/23/2016  .  Visit for preventive health examination 03/02/2014  . Encounter to establish care 01/28/2014  . ALLERGIC RHINITIS 01/08/2010   Past Medical History:  Diagnosis Date  . No known health problems     Family History  Problem Relation Age of Onset  . Hypertension Mother        Living  . Hypertension Father        Living  . Alzheimer's disease Paternal Grandmother   . Lung cancer Maternal Grandmother   . Lung cancer Maternal Grandfather   . Hypertension Brother   . Hyperlipidemia Brother   . Allergies Daughter        Environmental  . Asthma Daughter     Past Surgical History:  Procedure Laterality Date  . Unremarkable.     Social History   Occupational History  . Not on file  Tobacco Use  . Smoking status: Never  Smoker  . Smokeless tobacco: Never Used  Substance and Sexual Activity  . Alcohol use: No  . Drug use: No  . Sexual activity: Yes    Birth control/protection: None    Comment: married -

## 2019-10-07 ENCOUNTER — Ambulatory Visit: Payer: BC Managed Care – PPO | Admitting: Orthopaedic Surgery

## 2019-10-08 ENCOUNTER — Ambulatory Visit (INDEPENDENT_AMBULATORY_CARE_PROVIDER_SITE_OTHER): Payer: BC Managed Care – PPO | Admitting: Physical Medicine and Rehabilitation

## 2019-10-08 ENCOUNTER — Encounter: Payer: Self-pay | Admitting: Physical Medicine and Rehabilitation

## 2019-10-08 ENCOUNTER — Other Ambulatory Visit: Payer: Self-pay

## 2019-10-08 DIAGNOSIS — R202 Paresthesia of skin: Secondary | ICD-10-CM

## 2019-10-08 NOTE — Progress Notes (Signed)
Numeric Pain Rating Scale and Functional Assessment Average Pain 7   In the last MONTH (on 0-10 scale) has pain interfered with the following?  1. General activity like being  able to carry out your everyday physical activities such as walking, climbing stairs, carrying groceries, or moving a chair?  Rating(7)

## 2019-10-11 ENCOUNTER — Encounter: Payer: Self-pay | Admitting: Orthopaedic Surgery

## 2019-10-11 ENCOUNTER — Other Ambulatory Visit: Payer: Self-pay

## 2019-10-11 ENCOUNTER — Ambulatory Visit (INDEPENDENT_AMBULATORY_CARE_PROVIDER_SITE_OTHER): Payer: BC Managed Care – PPO | Admitting: Orthopaedic Surgery

## 2019-10-11 ENCOUNTER — Telehealth: Payer: Self-pay

## 2019-10-11 DIAGNOSIS — M79641 Pain in right hand: Secondary | ICD-10-CM | POA: Diagnosis not present

## 2019-10-11 DIAGNOSIS — M79601 Pain in right arm: Secondary | ICD-10-CM | POA: Diagnosis not present

## 2019-10-11 NOTE — Progress Notes (Signed)
The patient comes in today to go over nerve conduction studies of her right upper extremity.  She has been having hand and arm pain but also numbness in the median nerve distribution.  We have tried activity modification as well as a night splint.  She says that has helped.  The nerve conduction studies were reviewed and it does show that she has normal nerve studies of her right upper extremity.  There is no evidence of compressive neuropathy or any brachial plexus or nerve issues from the neck.  She is delighted to hear that the study is normal.  There is a family history of carpal tunnel syndrome and her mother has had surgery.  She does report that she is having some symptom relief with a night splint.  I recommended Voltaren gel to try over the transverse carpal ligament as well as her activity modification.  All question concerns were answered and addressed.  Follow-up will be as needed.  I did recommend a steroid injection in the transverse carpal ligament area if her symptoms persist and she will call and come back and see [place] if she needs that.

## 2019-10-11 NOTE — Telephone Encounter (Signed)
Plan: Impression: Essentially NORMAL electrodiagnostic study of the right upper limb.  There is no significant electrodiagnostic evidence of nerve entrapment, brachial plexopathy or cervical radiculopathy.

## 2019-10-11 NOTE — Progress Notes (Signed)
SISTER CARBONE - 43 y.o. female MRN [id number]  Date of birth: Sep 29, 1976  Office Visit Note: Visit Date: 10/08/2019 PCP: No primary care provider on file. Referred by: Mcarthur Rossetti*  Subjective: Chief Complaint  Patient presents with   Right Upper Arm - Pain, Numbness   HPI: Paige Wheeler is a 43 y.o. female who comes in today At the request of Dr. Jean Rosenthal for electrodiagnostic study of the right upper limb.  Patient is right-hand dominant and reports tingling and weakness and numbness throughout the entire arm and fingers in a nondermatomal fashion.  She has been having this for 3 or 4 months now and is becoming worse.  She reports worsening with typing at work and tightening with her fist or holding the steering wheel.  Her symptoms seem to be worse when she is holding her arms down and it seems like she becomes very weak and shakes her hand.  She is okay holding her arm up.  She does not really have much in the way of neck pain or frank radicular symptoms.  She has had no prior electrodiagnostic studies.  She reports that her mother had carpal tunnel syndrome and surgery that helped her a great deal.  She denies any left-sided complaints.  She is not diabetic.  ROS Otherwise per HPI.  Assessment & Plan: Visit Diagnoses:  1. Paresthesia of skin     Plan: Impression: Essentially NORMAL electrodiagnostic study of the right upper limb.  There is no significant electrodiagnostic evidence of nerve entrapment, brachial plexopathy or cervical radiculopathy.    As you know, purely sensory or demyelinating radiculopathies and chemical radiculitis may not be detected with this particular electrodiagnostic study.  Recommendations: 1.  Follow-up with referring physician. 2.  Continue current management of symptoms.  Meds & Orders: No orders of the defined types were placed in this encounter.   Orders Placed This Encounter  Procedures   NCV with EMG  (electromyography)    Follow-up: Return for Jean Rosenthal, M.D..   Procedures: No procedures performed  EMG & NCV Findings: All nerve conduction studies (as indicated in the following tables) were within normal limits.    All examined muscles (as indicated in the following table) showed no evidence of electrical instability.    Impression: Essentially NORMAL electrodiagnostic study of the right upper limb.  There is no significant electrodiagnostic evidence of nerve entrapment, brachial plexopathy or cervical radiculopathy.    As you know, purely sensory or demyelinating radiculopathies and chemical radiculitis may not be detected with this particular electrodiagnostic study.  Recommendations: 1.  Follow-up with referring physician. 2.  Continue current management of symptoms.  ___________________________ Laurence Spates FAAPMR Board Certified, American Board of Physical Medicine and Rehabilitation    Nerve Conduction Studies Anti Sensory Summary Table   Stim Site NR Peak (ms) Norm Peak (ms) P-T Amp (V) Norm P-T Amp Site1 Site2 Delta-P (ms) Dist (cm) Vel (m/s) Norm Vel (m/s)  Right Median Acr Palm Anti Sensory (2nd Digit)  30.8C  Wrist    3.4 <3.6 28.3 >10 Wrist Palm 1.7 0.0    Palm    1.7 <2.0 45.7         Right Radial Anti Sensory (Base 1st Digit)  31.1C  Wrist    2.1 <3.1 32.3  Wrist Base 1st Digit 2.1 0.0    Right Ulnar Anti Sensory (5th Digit)  31.1C  Wrist    3.1 <3.7 17.8 >15.0 Wrist 5th Digit 3.1 14.0 45 >38  Motor Summary Table   Stim Site NR Onset (ms) Norm Onset (ms) O-P Amp (mV) Norm O-P Amp Site1 Site2 Delta-0 (ms) Dist (cm) Vel (m/s) Norm Vel (m/s)  Right Median Motor (Abd Poll Brev)  31.2C  Wrist    3.4 <4.2 10.0 >5 Elbow Wrist 3.7 19.5 53 >50  Elbow    7.1  8.4         Right Ulnar Motor (Abd Dig Min)  31.2C  Wrist    2.9 <4.2 11.5 >3 B Elbow Wrist 3.3 20.5 62 >53  B Elbow    6.2  12.0  A Elbow B Elbow 1.4 10.0 71 >53  A Elbow    7.6  11.4           EMG   Side Muscle Nerve Root Ins Act Fibs Psw Amp Dur Poly Recrt Int Fraser Din Comment  Right Abd Poll Brev Median C8-T1 Nml Nml Nml Nml Nml 0 Nml Nml   Right 1stDorInt Ulnar C8-T1 Nml Nml Nml Nml Nml 0 Nml Nml   Right PronatorTeres Median C6-7 Nml Nml Nml Nml Nml 0 Nml Nml   Right Biceps Musculocut C5-6 Nml Nml Nml Nml Nml 0 Nml Nml   Right Deltoid Axillary C5-6 Nml Nml Nml Nml Nml 0 Nml Nml     Nerve Conduction Studies Anti Sensory Left/Right Comparison   Stim Site L Lat (ms) R Lat (ms) L-R Lat (ms) L Amp (V) R Amp (V) L-R Amp (%) Site1 Site2 L Vel (m/s) R Vel (m/s) L-R Vel (m/s)  Median Acr Palm Anti Sensory (2nd Digit)  30.8C  Wrist  3.4   28.3  Wrist Palm     Palm  1.7   45.7        Radial Anti Sensory (Base 1st Digit)  31.1C  Wrist  2.1   32.3  Wrist Base 1st Digit     Ulnar Anti Sensory (5th Digit)  31.1C  Wrist  3.1   17.8  Wrist 5th Digit  45    Motor Left/Right Comparison   Stim Site L Lat (ms) R Lat (ms) L-R Lat (ms) L Amp (mV) R Amp (mV) L-R Amp (%) Site1 Site2 L Vel (m/s) R Vel (m/s) L-R Vel (m/s)  Median Motor (Abd Poll Brev)  31.2C  Wrist  3.4   10.0  Elbow Wrist  53   Elbow  7.1   8.4        Ulnar Motor (Abd Dig Min)  31.2C  Wrist  2.9   11.5  B Elbow Wrist  62   B Elbow  6.2   12.0  A Elbow B Elbow  71   A Elbow  7.6   11.4           Waveforms:            Clinical History: No specialty comments available.   She reports that she has never smoked. She has never used smokeless tobacco. No results for input(s): HGBA1C, LABURIC in the last 8760 hours.  Objective:  VS:  HT:     WT:    BMI:      BP:    HR: bpm   TEMP: ( )   RESP:  Physical Exam Musculoskeletal:        General: No swelling, tenderness or deformity.     Comments: Inspection reveals no atrophy of the bilateral APB or FDI or hand intrinsics. There is no swelling, color changes, allodynia or dystrophic changes. There is 5  out of 5 strength in the bilateral wrist extension, finger abduction  and long finger flexion. There is intact sensation to light touch in all dermatomal and peripheral nerve distributions.  There is a negative Phalen's test bilaterally. There is a negative Hoffmann's test bilaterally.  Skin:    General: Skin is warm and dry.     Findings: No erythema or rash.  Neurological:     General: No focal deficit present.     Mental Status: She is alert and oriented to person, place, and time.     Motor: No weakness or abnormal muscle tone.     Coordination: Coordination normal.  Psychiatric:        Mood and Affect: Mood normal.        Behavior: Behavior normal.     Ortho Exam Imaging: No results found.  Past Medical/Family/Surgical/Social History: Medications & Allergies reviewed per EMR, new medications updated. Patient Active Problem List   Diagnosis Date Noted   Viral URI 01/23/2016   Visit for preventive health examination 03/02/2014   Encounter to establish care 01/28/2014   ALLERGIC RHINITIS 01/08/2010   Past Medical History:  Diagnosis Date   No known health problems    Family History  Problem Relation Age of Onset   Hypertension Mother        Living   Hypertension Father        Living   Alzheimer's disease Paternal Grandmother    Lung cancer Maternal Grandmother    Lung cancer Maternal Grandfather    Hypertension Brother    Hyperlipidemia Brother    Allergies Daughter        Environmental   Asthma Daughter    Past Surgical History:  Procedure Laterality Date   Unremarkable.     Social History   Occupational History   Not on file  Tobacco Use   Smoking status: Never Smoker   Smokeless tobacco: Never Used  Substance and Sexual Activity   Alcohol use: No   Drug use: No   Sexual activity: Yes    Birth control/protection: None    Comment: married -

## 2019-10-11 NOTE — Procedures (Signed)
EMG & NCV Findings: All nerve conduction studies (as indicated in the following tables) were within normal limits.    All examined muscles (as indicated in the following table) showed no evidence of electrical instability.    Impression: Essentially NORMAL electrodiagnostic study of the right upper limb.  There is no significant electrodiagnostic evidence of nerve entrapment, brachial plexopathy or cervical radiculopathy.    As you know, purely sensory or demyelinating radiculopathies and chemical radiculitis may not be detected with this particular electrodiagnostic study.  Recommendations: 1.  Follow-up with referring physician. 2.  Continue current management of symptoms.  ___________________________ Laurence Spates FAAPMR Board Certified, American Board of Physical Medicine and Rehabilitation    Nerve Conduction Studies Anti Sensory Summary Table   Stim Site NR Peak (ms) Norm Peak (ms) P-T Amp (V) Norm P-T Amp Site1 Site2 Delta-P (ms) Dist (cm) Vel (m/s) Norm Vel (m/s)  Right Median Acr Palm Anti Sensory (2nd Digit)  30.8C  Wrist    3.4 <3.6 28.3 >10 Wrist Palm 1.7 0.0    Palm    1.7 <2.0 45.7         Right Radial Anti Sensory (Base 1st Digit)  31.1C  Wrist    2.1 <3.1 32.3  Wrist Base 1st Digit 2.1 0.0    Right Ulnar Anti Sensory (5th Digit)  31.1C  Wrist    3.1 <3.7 17.8 >15.0 Wrist 5th Digit 3.1 14.0 45 >38   Motor Summary Table   Stim Site NR Onset (ms) Norm Onset (ms) O-P Amp (mV) Norm O-P Amp Site1 Site2 Delta-0 (ms) Dist (cm) Vel (m/s) Norm Vel (m/s)  Right Median Motor (Abd Poll Brev)  31.2C  Wrist    3.4 <4.2 10.0 >5 Elbow Wrist 3.7 19.5 53 >50  Elbow    7.1  8.4         Right Ulnar Motor (Abd Dig Min)  31.2C  Wrist    2.9 <4.2 11.5 >3 B Elbow Wrist 3.3 20.5 62 >53  B Elbow    6.2  12.0  A Elbow B Elbow 1.4 10.0 71 >53  A Elbow    7.6  11.4          EMG   Side Muscle Nerve Root Ins Act Fibs Psw Amp Dur Poly Recrt Int Fraser Din Comment  Right Abd Poll Brev Median  C8-T1 Nml Nml Nml Nml Nml 0 Nml Nml   Right 1stDorInt Ulnar C8-T1 Nml Nml Nml Nml Nml 0 Nml Nml   Right PronatorTeres Median C6-7 Nml Nml Nml Nml Nml 0 Nml Nml   Right Biceps Musculocut C5-6 Nml Nml Nml Nml Nml 0 Nml Nml   Right Deltoid Axillary C5-6 Nml Nml Nml Nml Nml 0 Nml Nml     Nerve Conduction Studies Anti Sensory Left/Right Comparison   Stim Site L Lat (ms) R Lat (ms) L-R Lat (ms) L Amp (V) R Amp (V) L-R Amp (%) Site1 Site2 L Vel (m/s) R Vel (m/s) L-R Vel (m/s)  Median Acr Palm Anti Sensory (2nd Digit)  30.8C  Wrist  3.4   28.3  Wrist Palm     Palm  1.7   45.7        Radial Anti Sensory (Base 1st Digit)  31.1C  Wrist  2.1   32.3  Wrist Base 1st Digit     Ulnar Anti Sensory (5th Digit)  31.1C  Wrist  3.1   17.8  Wrist 5th Digit  45    Motor Left/Right Comparison  Stim Site L Lat (ms) R Lat (ms) L-R Lat (ms) L Amp (mV) R Amp (mV) L-R Amp (%) Site1 Site2 L Vel (m/s) R Vel (m/s) L-R Vel (m/s)  Median Motor (Abd Poll Brev)  31.2C  Wrist  3.4   10.0  Elbow Wrist  53   Elbow  7.1   8.4        Ulnar Motor (Abd Dig Min)  31.2C  Wrist  2.9   11.5  B Elbow Wrist  62   B Elbow  6.2   12.0  A Elbow B Elbow  71   A Elbow  7.6   11.4           Waveforms:

## 2019-10-11 NOTE — Telephone Encounter (Signed)
May we have the results of this nerve study? This patient will be here this afternoon. Thank you!

## 2019-10-11 NOTE — Telephone Encounter (Signed)
Just sent you copy of test results, normal

## 2019-10-18 DIAGNOSIS — Z1231 Encounter for screening mammogram for malignant neoplasm of breast: Secondary | ICD-10-CM | POA: Diagnosis not present

## 2019-10-18 DIAGNOSIS — Z6831 Body mass index (BMI) 31.0-31.9, adult: Secondary | ICD-10-CM | POA: Diagnosis not present

## 2019-10-18 DIAGNOSIS — Z1151 Encounter for screening for human papillomavirus (HPV): Secondary | ICD-10-CM | POA: Diagnosis not present

## 2019-10-18 DIAGNOSIS — Z01419 Encounter for gynecological examination (general) (routine) without abnormal findings: Secondary | ICD-10-CM | POA: Diagnosis not present

## 2020-11-16 DIAGNOSIS — Z1231 Encounter for screening mammogram for malignant neoplasm of breast: Secondary | ICD-10-CM | POA: Diagnosis not present

## 2020-11-16 DIAGNOSIS — Z113 Encounter for screening for infections with a predominantly sexual mode of transmission: Secondary | ICD-10-CM | POA: Diagnosis not present

## 2020-11-16 DIAGNOSIS — Z6830 Body mass index (BMI) 30.0-30.9, adult: Secondary | ICD-10-CM | POA: Diagnosis not present

## 2020-11-16 DIAGNOSIS — Z01419 Encounter for gynecological examination (general) (routine) without abnormal findings: Secondary | ICD-10-CM | POA: Diagnosis not present

## 2020-11-16 DIAGNOSIS — Z124 Encounter for screening for malignant neoplasm of cervix: Secondary | ICD-10-CM | POA: Diagnosis not present

## 2022-06-24 LAB — EXTERNAL GENERIC LAB PROCEDURE: COLOGUARD: NEGATIVE
